# Patient Record
Sex: Female | Born: 1955
Health system: Southern US, Academic
[De-identification: ages and names within clinical notes are randomized; demographics above are authoritative.]

## PROBLEM LIST (undated history)

## (undated) ENCOUNTER — Encounter

## (undated) ENCOUNTER — Telehealth

## (undated) ENCOUNTER — Inpatient Hospital Stay

## (undated) ENCOUNTER — Other Ambulatory Visit

---

## 2016-04-11 DIAGNOSIS — K5900 Constipation, unspecified: Principal | ICD-10-CM

## 2016-04-13 MED ORDER — POLYETHYLENE GLYCOL 3350 PO POWD
1 refills
Start: 2016-04-13 — End: ?

## 2016-04-27 ENCOUNTER — Emergency Department: Admit: 2016-04-27 | Discharge: 2016-04-27

## 2016-04-27 ENCOUNTER — Encounter: Attending: Physician Assistant | Primary: Family Medicine

## 2016-04-27 ENCOUNTER — Inpatient Hospital Stay: Admit: 2016-04-27 | Discharge: 2016-04-27

## 2016-04-27 DIAGNOSIS — M1990 Unspecified osteoarthritis, unspecified site: Secondary | ICD-10-CM

## 2016-04-27 DIAGNOSIS — Z9109 Other allergy status, other than to drugs and biological substances: Secondary | ICD-10-CM

## 2016-04-27 DIAGNOSIS — K858 Other acute pancreatitis without necrosis or infection: Secondary | ICD-10-CM

## 2016-04-27 DIAGNOSIS — R011 Cardiac murmur, unspecified: Secondary | ICD-10-CM

## 2016-04-27 DIAGNOSIS — E1142 Type 2 diabetes mellitus with diabetic polyneuropathy: Secondary | ICD-10-CM

## 2016-04-27 DIAGNOSIS — R109 Unspecified abdominal pain: Principal | ICD-10-CM

## 2016-04-27 DIAGNOSIS — K219 Gastro-esophageal reflux disease without esophagitis: Secondary | ICD-10-CM

## 2016-04-27 DIAGNOSIS — Z8249 Family history of ischemic heart disease and other diseases of the circulatory system: Secondary | ICD-10-CM

## 2016-04-27 DIAGNOSIS — K859 Acute pancreatitis without necrosis or infection, unspecified: Secondary | ICD-10-CM

## 2016-04-27 DIAGNOSIS — I10 Essential (primary) hypertension: Secondary | ICD-10-CM

## 2016-04-27 DIAGNOSIS — R9341 Abnormal radiologic findings on diagnostic imaging of renal pelvis, ureter, or bladder: Secondary | ICD-10-CM

## 2016-04-27 DIAGNOSIS — Z79899 Other long term (current) drug therapy: Secondary | ICD-10-CM

## 2016-04-27 DIAGNOSIS — E119 Type 2 diabetes mellitus without complications: Principal | ICD-10-CM

## 2016-04-27 DIAGNOSIS — Z87891 Personal history of nicotine dependence: Secondary | ICD-10-CM

## 2016-04-27 DIAGNOSIS — J45909 Unspecified asthma, uncomplicated: Secondary | ICD-10-CM

## 2016-04-27 DIAGNOSIS — 1 ERRONEOUS ENCOUNTER--DISREGARD: Principal | ICD-9-CM

## 2016-04-27 DIAGNOSIS — Z803 Family history of malignant neoplasm of breast: Secondary | ICD-10-CM

## 2016-04-27 DIAGNOSIS — K5900 Constipation, unspecified: Secondary | ICD-10-CM

## 2016-04-27 DIAGNOSIS — N3001 Acute cystitis with hematuria: Secondary | ICD-10-CM

## 2016-04-27 DIAGNOSIS — Z794 Long term (current) use of insulin: Secondary | ICD-10-CM

## 2016-04-27 DIAGNOSIS — E785 Hyperlipidemia, unspecified: Secondary | ICD-10-CM

## 2016-04-27 MED ORDER — MORPHINE SULFATE 2 MG/ML IV SOLN CUSTOM JX
2 mg | Freq: Once | INTRAVENOUS | Status: CP
Start: 2016-04-27 — End: ?

## 2016-04-27 MED ORDER — ONDANSETRON HCL 4 MG/2ML IJ SOLN
4 mg | Freq: Once | INTRAVENOUS | Status: CP
Start: 2016-04-27 — End: ?

## 2016-04-27 MED ORDER — CEPHALEXIN 500 MG PO CAPS
500 mg | Freq: Three times a day (TID) | ORAL | 0 refills | Status: CP
Start: 2016-04-27 — End: 2016-05-18

## 2016-04-27 MED ORDER — IOHEXOL 300 MG/ML IJ SOLN
100 mL | Freq: Once | INTRAVENOUS | Status: DC
Start: 2016-04-27 — End: 2016-04-28

## 2016-04-27 MED ORDER — ONDANSETRON 4 MG PO TBDP
4 mg | Freq: Four times a day (QID) | ORAL | 0 refills | Status: CP | PRN
Start: 2016-04-27 — End: ?

## 2016-04-27 MED ORDER — KETOROLAC TROMETHAMINE 30 MG/ML IJ SOLN
30 mg | Freq: Once | INTRAVENOUS | Status: CP
Start: 2016-04-27 — End: ?

## 2016-04-27 MED ORDER — POLYETHYLENE GLYCOL 3350 PO POWD
3 refills | Status: CP
Start: 2016-04-27 — End: 2016-05-18

## 2016-04-27 MED ORDER — KETOROLAC TROMETHAMINE 10 MG PO TABS
10 mg | Freq: Four times a day (QID) | ORAL | 0 refills | Status: CP | PRN
Start: 2016-04-27 — End: 2016-07-24

## 2016-04-27 MED ORDER — IODIXANOL 320 MG/ML IV SOLN
100 mL | Freq: Once | INTRAVENOUS | Status: CP
Start: 2016-04-27 — End: ?

## 2016-04-27 NOTE — ED Notes
Patient complaining of abdominal pain, nausea, and constipation x 4 days. She also states she had a fever of 101F yesterday. Last normal BM was 1 week ago. She was being seen by Gastroenterology upstairs for the constipation, but they sent her here to the ED due to abdominal pain.  A&Ox3. Call bell within reach.

## 2016-04-27 NOTE — ED Provider Notes
Skin: Negative for color change, pallor and rash.   Neurological: Negative for dizziness, light-headedness and headaches.   Hematological: Negative for lymphadenopathy and easy bruising.     Psychiatric/Behavioral: Negative for confusion and depression.   Allergic/Immunologic: Negative for environmental allergies and food allergies.   Endocrine: Negative for polydipsia and polyuria.       Physical Exam       ED Triage Vitals   BP 04/27/16 1406 147/83   Pulse 04/27/16 1405 81   Resp 04/27/16 1407 18   Temp 04/27/16 1407 36.9 ?C (98.5 ?F)   Temp src 04/27/16 1407 Oral   Height 04/27/16 1407 1.651 m   Weight 04/27/16 1407 80 kg   SpO2 04/27/16 1405 100 %   BMI (Calculated) 04/27/16 1407 29.41       BP 142/90 - Pulse 81 - Temp 36.9 ?C (98.4 ?F) - Resp 16 - Ht 1.651 m - Wt 80 kg - SpO2 97% - BMI 29.35 kg/m2    Physical Exam   Constitutional: She is oriented to person, place, and time. She appears well-developed and well-nourished.   HENT:   Head: Normocephalic and atraumatic.   Eyes: Conjunctivae are normal.   Neck: Normal range of motion. Neck supple.   Cardiovascular: Normal rate and regular rhythm.    Pulmonary/Chest: Effort normal and breath sounds normal.   Abdominal: Soft. Normal appearance. There is generalized tenderness (worse in LLQ).   Musculoskeletal: Normal range of motion.   Neurological: She is alert and oriented to person, place, and time.   Skin: Skin is warm and dry.   Psychiatric: She has a normal mood and affect. Her behavior is normal.   Nursing note and vitals reviewed.      Differential DDx: diverticulitis, UTI, ischemic colitis, SBO, constipation, other    Is this an Emergent Medical Condition? Yes - Severe Pain/Acute Onset of Symptons  409.901 FS  641.19 FS  627.732 (16) FS    ED Workup   Procedures    Labs:  -   URINALYSIS W/MICROSCOPY - Abnormal        Result Value Ref Range    Protein-UA 30.0 (*) Negative mg/dL    Ketones UA Trace (*) Negative mg/dL    Nitrite -Ur Positive (*) Negative

## 2016-04-27 NOTE — ED Provider Notes
?   Heart murmur    ? Hyperlipidemia    ? Hypertension    ? Polyneuropathy in diabetes        Past Surgical History:   Procedure Laterality Date   ? CESAREAN SECTION     ? HERNIA REPAIR     ? HYSTERECTOMY         Family History   Problem Relation Age of Onset   ? High Blood Pressure Mother    ? Cancer Mother      breast and colon   ? Breast Cancer Mother 60   ? Depression Father    ? Heart Disease Father    ? High Blood Pressure Father    ? Cancer Father      brain   ? Cancer Sister      breast   ? Breast Cancer Sister 49   ? Cancer Maternal Aunt      throat       Social History     Social History   ? Marital status: Married     Spouse name: N/A   ? Number of children: N/A   ? Years of education: N/A     Social History Main Topics   ? Smoking status: Former Smoker   ? Smokeless tobacco: Former Neurosurgeon   ? Alcohol use No   ? Drug use: No   ? Sexual activity: Not on file     Other Topics Concern   ? Not on file     Social History Narrative       Review of Systems   Constitutional: Negative for fever, chills and fatigue.   HENT: Negative for ear pain and nasal discharge.    Eyes: Negative for pain and discharge.   Respiratory: Negative for cough, shortness of breath and wheezing.    Cardiovascular: Negative for chest pain.   Gastrointestinal: Positive for abdominal pain and constipation. Negative for nausea, vomiting, diarrhea and melena.   Genitourinary: Negative for dysuria and flank pain.   Musculoskeletal: Negative for back pain.   Skin: Negative for color change, pallor and rash.   Neurological: Negative for dizziness, light-headedness and headaches.   Hematological: Negative for lymphadenopathy and easy bruising.     Psychiatric/Behavioral: Negative for confusion and depression.   Allergic/Immunologic: Negative for environmental allergies and food allergies.   Endocrine: Negative for polydipsia and polyuria.       Physical Exam     ED Triage Vitals   BP 04/27/16 1407 147/83   Pulse 04/27/16 1407 83

## 2016-04-27 NOTE — ED Provider Notes
ED Course         ED Disposition   ED Disposition: No ED Disposition Set      ED Clinical Impression   ED Clinical Impression:   No Clinical Impression Set      ED Patient Status   Patient Status:   {SH ED JX PATIENT STATUS:[phone]}        ED Medical Evaluation Initiated   Medical Evaluation Initiated:   Yes, filed at 04/27/16 1409  by Bill Salinas, PA-C

## 2016-04-27 NOTE — ED Provider Notes
History     Chief Complaint   Patient presents with   ? Abdominal Pain   ? Constipation       HPI Comments: Patient is a 60 yo female with a PMH of DM (states well controlled), HTN, GERD, HLD who presents to the ED with a 4 day history of all over cramping/sharp/aching abdominal pain and a 7 day history of constipation.  No inciting events.  She states she usually takes glycolax powder for constipation which keeps her regular but it has not worked this past week.  Was at her GI doctor this AM who sent her to the ED due to pain.  Also complains of dysuria for the past couple days. She denies fever/chills/cp/sob/headache/focal neuro deficits today. Past abdominal surgeries include hernia repair, c-section, and hysterectomy. She also states h/o diverticulitis.    Patient is a 60 y.o. female presenting with abdominal pain. The history is provided by the patient. No language interpreter was used.   Abdominal Pain   Pain location:  Generalized  Pain quality: aching, cramping and sharp    Pain radiates to:  Does not radiate  Pain severity:  Severe  Onset quality:  Gradual  Duration:  4 days  Timing:  Constant  Progression:  Worsening  Chronicity:  New  Context: laxative use    Context: not alcohol use, not eating, not medication withdrawal, not recent illness, not recent travel, not retching, not sick contacts, not suspicious food intake and not trauma    Relieved by:  Nothing  Worsened by:  Nothing  Ineffective treatments:  None tried  Associated symptoms: constipation and dysuria    Associated symptoms: no chest pain, no chills, no cough, no diarrhea, no fatigue, no fever, no melena, no nausea, no shortness of breath and no vomiting    Risk factors: no alcohol abuse, no aspirin use, not elderly, no NSAID use, not obese and not pregnant        Allergies   Allergen Reactions   ? No Known Drug Allergy      No Known Drug Allergies       Patient's Medications   New Prescriptions

## 2016-04-27 NOTE — ED Provider Notes
History     Chief Complaint   Patient presents with   ? Abdominal Pain   ? Constipation       HPI Comments: Patient is a 60 yo female with a PMH of DM (states well controlled), HTN, GERD, HLD who presents to the ED with a 4 day history of all over cramping/sharp/aching abdominal pain and a 7 day history of constipation.  No inciting events.  She states she usually takes glycolax powder for constipation which keeps her regular but it has not worked this past week.  Was at her GI doctor this AM who sent her to the ED due to pain.  Also complains of dysuria for the past couple days. She denies fever/chills/cp/sob/headache/focal neuro deficits today. Past abdominal surgeries include hernia repair, c-section, and hysterectomy. She also states h/o diverticulitis.    Patient is a 59 y.o. female presenting with abdominal pain. The history is provided by the patient. No language interpreter was used.   Abdominal Pain   Pain location:  Generalized  Pain quality: aching, cramping and sharp    Pain radiates to:  Does not radiate  Pain severity:  Severe  Onset quality:  Gradual  Duration:  4 days  Timing:  Constant  Progression:  Worsening  Chronicity:  New  Context: laxative use    Context: not alcohol use, not eating, not medication withdrawal, not recent illness, not recent travel, not retching, not sick contacts, not suspicious food intake and not trauma    Relieved by:  Nothing  Worsened by:  Nothing  Ineffective treatments:  None tried  Associated symptoms: constipation and dysuria    Associated symptoms: no chest pain, no chills, no cough, no diarrhea, no fatigue, no fever, no melena, no nausea, no shortness of breath and no vomiting    Risk factors: no alcohol abuse, no aspirin use, not elderly, no NSAID use, not obese and not pregnant        Allergies   Allergen Reactions   ? No Known Drug Allergy      No Known Drug Allergies       Patient's Medications   New Prescriptions    No medications on file

## 2016-04-27 NOTE — ED Provider Notes
CEPHALEXIN (KEFLEX) 500 MG CAPSULE    Take 1 capsule by mouth 3 times daily.    KETOROLAC (TORADOL) 10 MG TABLET    Take 1 tablet by mouth every 6 hours as needed for pain.    ONDANSETRON (ZOFRAN-ODT) 4 MG TABLET DISINTEGRATING    Dissolve 1 tablet in mouth every 6 hours as needed.   Previous Medications    ATORVASTATIN (LIPITOR) 20 MG TABLET    Take 1 tablet by mouth nightly at bedtime.    ATORVASTATIN (LIPITOR) 20 MG TABLET    Take 1 tablet by mouth daily.    BUPROPION (WELLBUTRIN XL) 150 MG TABLET EXTENDED RELEASE 24 HOUR    Take 1 tablet by mouth every morning.    GUAIFENESIN-CODEINE 100-10 MG/5ML SYRUP    Take 5 mLs by mouth every 6 hours as needed.    HYDROCHLOROTHIAZIDE (HYDRODIURIL) 25 MG TABLET    Take 1 tablet by mouth daily.    HYDROCODONE-ACETAMINOPHEN (NORCO) 5-325 MG TABLET    Take 1 tablet by mouth every 8 hours as needed for pain. Earliest Fill Date: 11/01/15    IBUPROFEN (ADVIL,MOTRIN) 600 MG TABLET    Take 1 Tablet by mouth every 6 hours as needed for pain.    INSULIN ASPART (NOVOLOG) 100 UNIT/ML SOLUTION PEN-INJECTOR    Inject 15 Units into the skin 3 times daily (before meals).    INSULIN DETEMIR (LEVEMIR) 100 UNIT/ML SOLUTION PEN-INJECTOR    Inject 30 Units into the skin nightly at bedtime.    LISINOPRIL (PRINIVIL,ZESTRIL) 5 MG TABLET    Take 1 Tablet by mouth daily.    NOVOFINE 32G X 6 MM MISCELLANEOUS    USE 1 AT BEDTIME AND 3 DAILY BEFORE MEALS    OMEPRAZOLE (PRILOSEC) 40 MG CAPSULE DELAYED RELEASE    Take 1 capsule by mouth daily.    POLYETHYLENE GLYCOL (GLYCOLAX) POWDER    MIX 2 CAPFUL (34 GM) IN 8 - 16 OUNCES OF WATER, JUICE OR TEA AND DRINK DAILY.    PROAIR HFA 108 (90 BASE) MCG/ACT AEROSOL SOLUTION    Inhale 1 puff every 6 hours as needed for wheezing.    VARENICLINE (CHANTIX STARTING MONTH PAK) 0.5 MG X 11 & 1 MG X 42 TABLET    Take one 0.5mg  tab by mouth once daily x3 days; then increase   Modified Medications    Modified Medication Previous Medication

## 2016-04-27 NOTE — ED Provider Notes
History     Chief Complaint   Patient presents with   ? Abdominal Pain   ? Constipation       HPI Comments: Patient is a 60 yo female with a PMH of DM (states well controlled), HTN, GERD, HLD who presents to the ED with a 4 day history of all over cramping/sharp/aching abdominal pain and a 7 day history of constipation.  No inciting events.  She states she usually takes glycolax powder for constipation which keeps her regular but it has not worked this past week.  Was at her GI doctor this AM who sent her to the ED due to pain.  She denies fever/chills/cp/sob/headache/focal neuro deficits today. Past abdominal surgeries include hernia repair, c-section, and hysterectomy. She also states h/o diverticulitis.    Patient is a 60 y.o. female presenting with abdominal pain. The history is provided by the patient. No language interpreter was used.   Abdominal Pain   Pain location:  Generalized  Pain quality: aching, cramping and sharp    Pain radiates to:  Does not radiate  Pain severity:  Severe  Onset quality:  Gradual  Duration:  4 days  Timing:  Constant  Progression:  Worsening  Chronicity:  New  Context: laxative use    Context: not alcohol use, not eating, not medication withdrawal, not recent illness, not recent travel, not retching, not sick contacts, not suspicious food intake and not trauma    Relieved by:  Nothing  Worsened by:  Nothing  Ineffective treatments:  None tried  Associated symptoms: constipation    Associated symptoms: no chest pain, no chills, no cough, no diarrhea, no dysuria, no fatigue, no fever, no melena, no nausea, no shortness of breath and no vomiting    Risk factors: no alcohol abuse, no aspirin use, not elderly, no NSAID use, not obese and not pregnant        Allergies   Allergen Reactions   ? No Known Drug Allergy      No Known Drug Allergies       Patient's Medications   New Prescriptions    No medications on file   Previous Medications

## 2016-04-27 NOTE — ED Notes
Patient given verbal and written discharge instructions. Verbalized understanding. Discharged home with all personal belongings. Patient in stable condition.

## 2016-04-27 NOTE — ED Provider Notes
Previous Medications    ATORVASTATIN (LIPITOR) 20 MG TABLET    Take 1 tablet by mouth nightly at bedtime.    ATORVASTATIN (LIPITOR) 20 MG TABLET    Take 1 tablet by mouth daily.    BUPROPION (WELLBUTRIN XL) 150 MG TABLET EXTENDED RELEASE 24 HOUR    Take 1 tablet by mouth every morning.    GUAIFENESIN-CODEINE 100-10 MG/5ML SYRUP    Take 5 mLs by mouth every 6 hours as needed.    HYDROCHLOROTHIAZIDE (HYDRODIURIL) 25 MG TABLET    Take 1 tablet by mouth daily.    HYDROCODONE-ACETAMINOPHEN (NORCO) 5-325 MG TABLET    Take 1 tablet by mouth every 8 hours as needed for pain. Earliest Fill Date: 11/01/15    IBUPROFEN (ADVIL,MOTRIN) 600 MG TABLET    Take 1 Tablet by mouth every 6 hours as needed for pain.    INSULIN ASPART (NOVOLOG) 100 UNIT/ML SOLUTION PEN-INJECTOR    Inject 15 Units into the skin 3 times daily (before meals).    INSULIN DETEMIR (LEVEMIR) 100 UNIT/ML SOLUTION PEN-INJECTOR    Inject 30 Units into the skin nightly at bedtime.    LISINOPRIL (PRINIVIL,ZESTRIL) 5 MG TABLET    Take 1 Tablet by mouth daily.    NOVOFINE 32G X 6 MM MISCELLANEOUS    USE 1 AT BEDTIME AND 3 DAILY BEFORE MEALS    OMEPRAZOLE (PRILOSEC) 40 MG CAPSULE DELAYED RELEASE    Take 1 capsule by mouth daily.    POLYETHYLENE GLYCOL (GLYCOLAX) POWDER    Mix 2 capful (34 gm) in 8 - 16 ounces of water, juice or tea and drink daily.    POLYETHYLENE GLYCOL (GLYCOLAX) POWDER    MIX 2 CAPFUL (34 GM) IN 8 - 16 OUNCES OF WATER, JUICE OR TEA AND DRINK DAILY.    PROAIR HFA 108 (90 BASE) MCG/ACT AEROSOL SOLUTION    Inhale 1 puff every 6 hours as needed for wheezing.    VARENICLINE (CHANTIX STARTING MONTH PAK) 0.5 MG X 11 & 1 MG X 42 TABLET    Take one 0.5mg  tab by mouth once daily x3 days; then increase   Modified Medications    No medications on file   Discontinued Medications    No medications on file       Past Medical History:   Diagnosis Date   ? Allergy to environmental factors    ? Arthritis    ? Asthma    ? Diabetes mellitus

## 2016-04-27 NOTE — ED Provider Notes
{  Imaging [phone]}    EKG (Read by ED Provider):  {EKG [phone]}    MDM    ED Course & Re-Evaluation     ED Course         ED Disposition   ED Disposition: No ED Disposition Set      ED Clinical Impression   ED Clinical Impression:   No Clinical Impression Set      ED Patient Status   Patient Status:   {SH ED [facility] PATIENT STATUS:[phone]}        ED Medical Evaluation Initiated   Medical Evaluation Initiated:   Yes, filed at 04/27/16 1409  by Bill Salinas, PA-C

## 2016-04-27 NOTE — ED Provider Notes
ATORVASTATIN (LIPITOR) 20 MG TABLET    Take 1 tablet by mouth nightly at bedtime.    ATORVASTATIN (LIPITOR) 20 MG TABLET    Take 1 tablet by mouth daily.    BUPROPION (WELLBUTRIN XL) 150 MG TABLET EXTENDED RELEASE 24 HOUR    Take 1 tablet by mouth every morning.    GUAIFENESIN-CODEINE 100-10 MG/5ML SYRUP    Take 5 mLs by mouth every 6 hours as needed.    HYDROCHLOROTHIAZIDE (HYDRODIURIL) 25 MG TABLET    Take 1 tablet by mouth daily.    HYDROCODONE-ACETAMINOPHEN (NORCO) 5-325 MG TABLET    Take 1 tablet by mouth every 8 hours as needed for pain. Earliest Fill Date: 11/01/15    IBUPROFEN (ADVIL,MOTRIN) 600 MG TABLET    Take 1 Tablet by mouth every 6 hours as needed for pain.    INSULIN ASPART (NOVOLOG) 100 UNIT/ML SOLUTION PEN-INJECTOR    Inject 15 Units into the skin 3 times daily (before meals).    INSULIN DETEMIR (LEVEMIR) 100 UNIT/ML SOLUTION PEN-INJECTOR    Inject 30 Units into the skin nightly at bedtime.    LISINOPRIL (PRINIVIL,ZESTRIL) 5 MG TABLET    Take 1 Tablet by mouth daily.    NOVOFINE 32G X 6 MM MISCELLANEOUS    USE 1 AT BEDTIME AND 3 DAILY BEFORE MEALS    OMEPRAZOLE (PRILOSEC) 40 MG CAPSULE DELAYED RELEASE    Take 1 capsule by mouth daily.    POLYETHYLENE GLYCOL (GLYCOLAX) POWDER    Mix 2 capful (34 gm) in 8 - 16 ounces of water, juice or tea and drink daily.    POLYETHYLENE GLYCOL (GLYCOLAX) POWDER    MIX 2 CAPFUL (34 GM) IN 8 - 16 OUNCES OF WATER, JUICE OR TEA AND DRINK DAILY.    PROAIR HFA 108 (90 BASE) MCG/ACT AEROSOL SOLUTION    Inhale 1 puff every 6 hours as needed for wheezing.    VARENICLINE (CHANTIX STARTING MONTH PAK) 0.5 MG X 11 & 1 MG X 42 TABLET    Take one 0.5mg  tab by mouth once daily x3 days; then increase   Modified Medications    No medications on file   Discontinued Medications    No medications on file       Past Medical History:   Diagnosis Date   ? Allergy to environmental factors    ? Arthritis    ? Asthma    ? Diabetes mellitus    ? GERD (gastroesophageal reflux disease)

## 2016-04-27 NOTE — ED Provider Notes
BP 04/27/16 1407 147/83   Pulse 04/27/16 1407 83   Resp 04/27/16 1407 18   Temp 04/27/16 1407 36.9 ?C (98.5 ?F)   Temp src 04/27/16 1407 Oral   Height 04/27/16 1407 1.651 m   Weight 04/27/16 1407 80 kg   SpO2 04/27/16 1407 99 %   BMI (Calculated) 04/27/16 1407 29.41       BP 147/83 - Pulse 83 - Temp 36.9 ?C (98.5 ?F) (Oral)  - Resp 18 - Ht 1.651 m - Wt 80 kg - SpO2 99% - BMI 29.35 kg/m2    Physical Exam   Constitutional: She is oriented to person, place, and time. She appears well-developed and well-nourished.   HENT:   Head: Normocephalic and atraumatic.   Eyes: Conjunctivae are normal.   Neck: Normal range of motion. Neck supple.   Cardiovascular: Normal rate and regular rhythm.    Pulmonary/Chest: Effort normal and breath sounds normal.   Abdominal: Soft. Normal appearance. There is generalized tenderness (worse in LLQ).   Musculoskeletal: Normal range of motion.   Neurological: She is alert and oriented to person, place, and time.   Skin: Skin is warm and dry.   Psychiatric: She has a normal mood and affect. Her behavior is normal.   Nursing note and vitals reviewed.      Differential DDx: ***    Is this an Emergent Medical Condition? {SH ED EMERGENT MEDICAL CONDITION:[id number]}  409.901 FS  641.19 FS  627.732 (16) FS    ED Workup   Procedures    Labs:  - - No data to display      Imaging (Read by ED Provider):  {Imaging [phone]}    EKG (Read by ED Provider):  {EKG [phone]}    MDM  Number of Diagnoses or Management Options     Amount and/or Complexity of Data Reviewed  Clinical lab tests: ordered and reviewed  Tests in the radiology section of CPT?: ordered and reviewed  Discussion of test results with the performing providers: no  Decide to obtain previous medical records or to obtain history from someone other than the patient: no  Obtain history from someone other than the patient: no  Review and summarize past medical records: no

## 2016-04-27 NOTE — ED Provider Notes
Resp 04/27/16 1407 18   Temp 04/27/16 1407 36.9 ?C (98.5 ?F)   Temp src 04/27/16 1407 Oral   Height 04/27/16 1407 1.651 m   Weight 04/27/16 1407 80 kg   SpO2 04/27/16 1407 99 %   BMI (Calculated) 04/27/16 1407 29.41       BP 147/83 - Pulse 83 - Temp 36.9 ?C (98.5 ?F) (Oral)  - Resp 18 - Ht 1.651 m - Wt 80 kg - SpO2 99% - BMI 29.35 kg/m2    Physical Exam   Constitutional: She is oriented to person, place, and time. She appears well-developed and well-nourished.   HENT:   Head: Normocephalic and atraumatic.   Eyes: Conjunctivae are normal.   Neck: Normal range of motion. Neck supple.   Cardiovascular: Normal rate and regular rhythm.    Pulmonary/Chest: Effort normal and breath sounds normal.   Abdominal: Soft. Normal appearance. There is generalized tenderness (worse in LLQ).   Musculoskeletal: Normal range of motion.   Neurological: She is alert and oriented to person, place, and time.   Skin: Skin is warm and dry.   Psychiatric: She has a normal mood and affect. Her behavior is normal.   Nursing note and vitals reviewed.      Differential DDx: ***    Is this an Emergent Medical Condition? {SH ED EMERGENT MEDICAL [phone]}  409.901 FS  641.19 FS  627.732 (16) FS    ED Workup   Procedures    Labs:  - - No data to display      Imaging (Read by ED Provider):  {Imaging findings:[phone]}    EKG (Read by ED Provider):  {EKG findings:[phone]}    MDM  Number of Diagnoses or Management Options     Amount and/or Complexity of Data Reviewed  Clinical lab tests: ordered and reviewed  Tests in the radiology section of CPT?: ordered and reviewed  Discussion of test results with the performing providers: no  Decide to obtain previous medical records or to obtain history from someone other than the patient: no  Obtain history from someone other than the patient: no  Review and summarize past medical records: no  Independent visualization of images, tracings, or specimens: yes        ED Course & Re-Evaluation

## 2016-04-27 NOTE — ED Provider Notes
?   GERD (gastroesophageal reflux disease)    ? Heart murmur    ? Hyperlipidemia    ? Hypertension    ? Polyneuropathy in diabetes        Past Surgical History:   Procedure Laterality Date   ? CESAREAN SECTION     ? HERNIA REPAIR     ? HYSTERECTOMY         Family History   Problem Relation Age of Onset   ? High Blood Pressure Mother    ? Cancer Mother      breast and colon   ? Breast Cancer Mother 70   ? Depression Father    ? Heart Disease Father    ? High Blood Pressure Father    ? Cancer Father      brain   ? Cancer Sister      breast   ? Breast Cancer Sister 43   ? Cancer Maternal Aunt      throat       Social History     Social History   ? Marital status: Married     Spouse name: N/A   ? Number of children: N/A   ? Years of education: N/A     Social History Main Topics   ? Smoking status: Former Smoker   ? Smokeless tobacco: Former Neurosurgeon   ? Alcohol use No   ? Drug use: No   ? Sexual activity: Not on file     Other Topics Concern   ? Not on file     Social History Narrative       Review of Systems   Constitutional: Negative for fever, chills and fatigue.   HENT: Negative for ear pain and nasal discharge.    Eyes: Negative for pain and discharge.   Respiratory: Negative for cough, shortness of breath and wheezing.    Cardiovascular: Negative for chest pain.   Gastrointestinal: Positive for abdominal pain and constipation. Negative for nausea, vomiting, diarrhea and melena.   Genitourinary: Positive for dysuria. Negative for flank pain.   Musculoskeletal: Negative for back pain.   Skin: Negative for color change, pallor and rash.   Neurological: Negative for dizziness, light-headedness and headaches.   Hematological: Negative for lymphadenopathy and easy bruising.     Psychiatric/Behavioral: Negative for confusion and depression.   Allergic/Immunologic: Negative for environmental allergies and food allergies.   Endocrine: Negative for polydipsia and polyuria.       Physical Exam     ED Triage Vitals

## 2016-04-27 NOTE — ED Provider Notes
Urea Nitrogen 11  6 - 22 mg/dL    Creatinine 0.71  0.51 - 0.95 mg/dL    BUN/Creatinine Ratio 15.5  6.0 - 22.0 (calc)    Glucose 95  71 - 99 mg/dL    Calcium 9.4  8.6 - 10.0 mg/dL    Osmolality Calc 287.9      Anion Gap 16  4 - 16 mmol/L    EGFR >59  mL/min/1.73M2    Comment:   Reference range: =>90 ml/min/1.73M2  eGFR estimates are unable to accurately differentiate levels of GFR above 60 ml/min/1.73M2.   CBC AND DIFFERENTIAL         Imaging (Read by ED Provider):  Per Radiology: Ct Ed Only Abd/pel W/ Iv Con (no Oral)    Result Date: 04/27/2016  Procedure:  CT ED ONLY ABD/PELVIS- W/IV CON ONLY (NO ORAL CONTRAST) Ordering History:  Abdominal pain Comparison: None Technique: Axial images of the abdomen and pelvis from the lung bases to the proximal femurs  after the administration of intravenous contrast are performed.   High resolution multiplanar reconstructions are also obtained. Findings: Abdomen: Liver: Normal liver morphology without a discrete mass Gallbladder: No radiopaque gallstones or biliary ductal dilatation Spleen: Not enlarged and no discrete mass is identified Pancreas: There is mild interstitial edema within the head and neck of the pancreas. There is extensive peripancreatic fat stranding near the head, neck, and uncinate process. No ductal dilatation. No peripancreatic fluid collection. The pancreas enhances uniformly. The splenic and portal veins are patent. Kidneys: Both kidneys enhance homogeneously without a discrete mass or hydronephrosis. 2 cm cyst in the medial lower pole of the left kidney. A few other small cysts are too small to characterize by CT imaging. 5 mm calcification in upper pole calyx on the left. Adrenals: Both adrenal glands are normal Bowel: There is moderate mural thickening of the second third and fourth portions of the duodenum. The jejunal and ileal loops are normal. No obstruction. The appendix is identified and is normal.

## 2016-04-27 NOTE — ED Provider Notes
Leukocytes -Ur Small (*) Negative    WBC -Ur 31 (*) 0 - 5 /HPF    Bacteria -Ur 2+ (*) None seen /HPF    Color -Ur Yellow  Amber    Clarity, UA Hazy  Hazy    Specific Gravity, Urine 1.016  1.003 - 1.030    pH, Urine 7.0  4.5 - 8.0    Glucose -Ur Negative  Negative mg/dL    Bilirubin -Ur Negative  Negative    Blood -Ur Negative  Negative    Urobilinogen -Ur Normal  Normal    RBC -Ur 1  0 - 5 /HPF    Squam Epithel, UA <1  Not established /HPF    Mucus -Ur Rare  2+ /LPF    ASCORBIC ACID Negative  20 mg/dL   LIPASE - Abnormal     Lipase 64 (*) 0 - 60 U/L   HEPATIC FUNCTION PANEL - Abnormal     AST 11 (*) 14 - 33 IU/L    Albumin 4.4  3.8 - 4.9 g/dL    Total Bilirubin 0.4  0.2 - 1.0 mg/dL    Bilirubin, Direct 0.2  0.0 - 0.2 mg/dL    Bilirubin, Indirect 0.2  mg/dL    Alkaline Phosphatase 75  35 - 104 IU/L    ALT 10  10 - 42 IU/L    Total Protein 7.2  6.5 - 8.3 g/dL    ALBUMIN/GLOBULIN RATIO 1.6  (calc)    Calc Total Globuin 2.8  gm/dL   CBC AUTODIFF - Abnormal     MPV 11.6 (*) 9.5 - 11.5 fl    Monocytes % 6.6 (*) 2.0 - 6.0 %    WBC 10.00  4.5 - 11 x10E3/uL    RBC 4.56  4.20 - 5.40 x10E6/uL    Hemoglobin 13.1  12.0 - 16.0 g/dL    Hematocrit 15.1  76.1 - 47.0 %    MCV 83.6  82.0 - 101.0 fl    MCH 28.7  27.0 - 34.0 pg    MCHC 34.4  31.0 - 36.0 g/dL    RDW 60.7  37.1 - 06.2 %    Platelet Count 252  140 - 440 thou/cu mm    nRBC % 0.0  0.0 - 1.0 %    Absolute NRBC Count 0.00      Neutrophils % 65.6  34.0 - 73.0 %    Lymphocytes % 26.0  25.0 - 45.0 %    Eosinophils % 1.2  1.0 - 4.0 %    Immature Granulocytes % 0.3  0.0 - 2.0 %    Neutrophils Absolute 6.56  1.80 - 8.70 x10E3/uL    Lymphocytes Absolute 2.60  x10E3/uL    Monocytes Absolute 0.66  x10E3/uL    Eosinophils Absolute 0.12  x10E3/uL    Basophil Absolute 0.03  x10E3/uL    Basophils % 0.3  0 - 1 %   CULTURE, URINE   BASIC METABOLIC PANEL    Sodium 145  694 - 145 mmol/L    Potassium 3.5  3.3 - 4.6 mmol/L    Chloride 104  101 - 110 mmol/L    CO2 25  21 - 29 mmol/L

## 2016-04-27 NOTE — ED Provider Notes
History     Chief Complaint   Patient presents with   ? Abdominal Pain   ? Constipation       HPI    Allergies   Allergen Reactions   ? No Known Drug Allergy      No Known Drug Allergies       Patient's Medications   New Prescriptions    No medications on file   Previous Medications    ATORVASTATIN (LIPITOR) 20 MG TABLET    Take 1 tablet by mouth nightly at bedtime.    ATORVASTATIN (LIPITOR) 20 MG TABLET    Take 1 tablet by mouth daily.    BUPROPION (WELLBUTRIN XL) 150 MG TABLET EXTENDED RELEASE 24 HOUR    Take 1 tablet by mouth every morning.    GUAIFENESIN-CODEINE 100-10 MG/5ML SYRUP    Take 5 mLs by mouth every 6 hours as needed.    HYDROCHLOROTHIAZIDE (HYDRODIURIL) 25 MG TABLET    Take 1 tablet by mouth daily.    HYDROCODONE-ACETAMINOPHEN (NORCO) 5-325 MG TABLET    Take 1 tablet by mouth every 8 hours as needed for pain. Earliest Fill Date: 11/01/15    IBUPROFEN (ADVIL,MOTRIN) 600 MG TABLET    Take 1 Tablet by mouth every 6 hours as needed for pain.    INSULIN ASPART (NOVOLOG) 100 UNIT/ML SOLUTION PEN-INJECTOR    Inject 15 Units into the skin 3 times daily (before meals).    INSULIN DETEMIR (LEVEMIR) 100 UNIT/ML SOLUTION PEN-INJECTOR    Inject 30 Units into the skin nightly at bedtime.    LISINOPRIL (PRINIVIL,ZESTRIL) 5 MG TABLET    Take 1 Tablet by mouth daily.    NOVOFINE 32G X 6 MM MISCELLANEOUS    USE 1 AT BEDTIME AND 3 DAILY BEFORE MEALS    OMEPRAZOLE (PRILOSEC) 40 MG CAPSULE DELAYED RELEASE    Take 1 capsule by mouth daily.    POLYETHYLENE GLYCOL (GLYCOLAX) POWDER    Mix 2 capful (34 gm) in 8 - 16 ounces of water, juice or tea and drink daily.    POLYETHYLENE GLYCOL (GLYCOLAX) POWDER    MIX 2 CAPFUL (34 GM) IN 8 - 16 OUNCES OF WATER, JUICE OR TEA AND DRINK DAILY.    PROAIR HFA 108 (90 BASE) MCG/ACT AEROSOL SOLUTION    Inhale 1 puff every 6 hours as needed for wheezing.    VARENICLINE (CHANTIX STARTING MONTH PAK) 0.5 MG X 11 & 1 MG X 42 TABLET    Take one 0.5mg  tab by mouth once daily x3 days; then increase

## 2016-04-27 NOTE — ED Provider Notes
POLYETHYLENE GLYCOL (GLYCOLAX) POWDER polyethylene glycol (GLYCOLAX) Powder       Mix 2 capful (34 gm) in 8 - 16 ounces of water, juice or tea and drink daily.    Mix 2 capful (34 gm) in 8 - 16 ounces of water, juice or tea and drink daily.   Discontinued Medications    No medications on file       Past Medical History:   Diagnosis Date   ? Allergy to environmental factors    ? Arthritis    ? Asthma    ? Diabetes mellitus    ? GERD (gastroesophageal reflux disease)    ? Heart murmur    ? Hyperlipidemia    ? Hypertension    ? Polyneuropathy in diabetes        Past Surgical History:   Procedure Laterality Date   ? CESAREAN SECTION     ? HERNIA REPAIR     ? HYSTERECTOMY         Family History   Problem Relation Age of Onset   ? High Blood Pressure Mother    ? Cancer Mother      breast and colon   ? Breast Cancer Mother 38   ? Depression Father    ? Heart Disease Father    ? High Blood Pressure Father    ? Cancer Father      brain   ? Cancer Sister      breast   ? Breast Cancer Sister 18   ? Cancer Maternal Aunt      throat       Social History     Social History   ? Marital status: Married     Spouse name: N/A   ? Number of children: N/A   ? Years of education: N/A     Social History Main Topics   ? Smoking status: Former Smoker   ? Smokeless tobacco: Former Neurosurgeon   ? Alcohol use No   ? Drug use: No   ? Sexual activity: Not Asked     Other Topics Concern   ? None     Social History Narrative       Review of Systems   Constitutional: Negative for fever, chills and fatigue.   HENT: Negative for ear pain and nasal discharge.    Eyes: Negative for pain and discharge.   Respiratory: Negative for cough, shortness of breath and wheezing.    Cardiovascular: Negative for chest pain.   Gastrointestinal: Positive for abdominal pain and constipation. Negative for nausea, vomiting, diarrhea and melena.   Genitourinary: Positive for dysuria. Negative for flank pain.   Musculoskeletal: Negative for back pain.

## 2016-04-27 NOTE — ED Provider Notes
Modified Medications    No medications on file   Discontinued Medications    No medications on file       Past Medical History:   Diagnosis Date   ? Allergy to environmental factors    ? Arthritis    ? Asthma    ? Diabetes mellitus    ? GERD (gastroesophageal reflux disease)    ? Heart murmur    ? Hyperlipidemia    ? Hypertension    ? Polyneuropathy in diabetes        Past Surgical History:   Procedure Laterality Date   ? CESAREAN SECTION     ? HERNIA REPAIR     ? HYSTERECTOMY         Family History   Problem Relation Age of Onset   ? High Blood Pressure Mother    ? Cancer Mother      breast and colon   ? Breast Cancer Mother 23   ? Depression Father    ? Heart Disease Father    ? High Blood Pressure Father    ? Cancer Father      brain   ? Cancer Sister      breast   ? Breast Cancer Sister 63   ? Cancer Maternal Aunt      throat       Social History     Social History   ? Marital status: Married     Spouse name: N/A   ? Number of children: N/A   ? Years of education: N/A     Social History Main Topics   ? Smoking status: Former Smoker   ? Smokeless tobacco: Former Neurosurgeon   ? Alcohol use No   ? Drug use: No   ? Sexual activity: Not on file     Other Topics Concern   ? Not on file     Social History Narrative       Review of Systems    Physical Exam     ED Triage Vitals   BP 04/27/16 1407 147/83   Pulse 04/27/16 1407 83   Resp 04/27/16 1407 18   Temp 04/27/16 1407 36.9 ?C (98.5 ?F)   Temp src 04/27/16 1407 Oral   Height 04/27/16 1407 1.651 m   Weight 04/27/16 1407 80 kg   SpO2 04/27/16 1407 99 %   BMI (Calculated) 04/27/16 1407 29.41       BP 147/83 - Pulse 83 - Temp 36.9 ?C (98.5 ?F) (Oral)  - Resp 18 - Ht 1.651 m - Wt 80 kg - SpO2 99% - BMI 29.35 kg/m2    Physical Exam    Differential DDx: ***    Is this an Emergent Medical Condition? {SH ED EMERGENT MEDICAL [phone]}  409.901 FS  641.19 FS  627.732 (16) FS    ED Workup   Procedures    Labs:  - - No data to display      Imaging (Read by ED Provider):

## 2016-04-27 NOTE — ED Provider Notes
has not been vomiting.  Asking for snacks and ginger ale.  Will DC with toradol and zofran.  BRAT diet information given.  Will DC with abx for UTI, toradol and zofran.  Return precautions given.  AMenable to DC.    ED Disposition   ED Disposition: Discharge      ED Clinical Impression   ED Clinical Impression:   Other pancreatitis  Constipation, unspecified constipation type  Acute cystitis with hematuria      ED Patient Status   Patient Status:   Good        ED Medical Evaluation Initiated   Medical Evaluation Initiated:   Yes, filed at 04/27/16 1409  by Bill Salinas, PA-C

## 2016-04-27 NOTE — ED Provider Notes
Independent visualization of images, tracings, or specimens: yes        ED Course & Re-Evaluation     ED Course         ED Disposition   ED Disposition: No ED Disposition Set      ED Clinical Impression   ED Clinical Impression:   No Clinical Impression Set      ED Patient Status   Patient Status:   {SH ED JX PATIENT STATUS:[id number]}        ED Medical Evaluation Initiated   Medical Evaluation Initiated:   Yes, filed at 04/27/16 1409  by Bill Salinas, PA-C

## 2016-04-27 NOTE — ED Provider Notes
Scattered colonic diverticulosis. Mesentery/retroperitoneum: Diffuse true peritoneal fat stranding within the upper abdomen extending into the lesser sac. Small. Umbilical hernia containing trace fluid. Pelvis: Mild concentric bladder wall thickening. Calcified uterine fibroids. Vasculature: The celiac axis, SMA and IMA are patent. The portal vein, splenic vein and SMV are  patent without thrombus Osseous structures: No suspicious osseous lesions Lung bases: Dependent changes in the lung bases. Probable remote LAD territory subendocardial infarct.     Impression: 1. Findings are most consistent with acute interstitial pancreatitis. No peripancreatic fluid collection. Normal pancreatic enhancement. Correlation with lipase is recommended. 2. Moderate mural thickening of the duodenum is likely reactive to inflammation in the upper abdomen. 3. Colonic diverticulosis without diverticulitis. 4. Mild concentric bladder wall thickening, likely due to decompression. Read By Rolly Salter Letter  Electronically Verified By - Rolly Salter Letter  Released Date Time - 04/27/2016 6:34 PM  Resident -       EKG (Read by ED Provider):  not applicable    MDM  Number of Diagnoses or Management Options     Amount and/or Complexity of Data Reviewed  Clinical lab tests: ordered and reviewed  Tests in the radiology section of CPT?: ordered and reviewed  Discussion of test results with the performing providers: no  Decide to obtain previous medical records or to obtain history from someone other than the patient: no  Obtain history from someone other than the patient: no  Review and summarize past medical records: no  Independent visualization of images, tracings, or specimens: yes        ED Course & Re-Evaluation     ED Course     Mild pancreatitis seen on CT.  Lipase mildly elevated at 66.  Urinalysis shows nitrie positive UTI.  No white count, no bands. Electrolytes wnl.  Patient doing much better after toradol and zofran.  She has no fever and

## 2016-04-28 NOTE — Progress Notes
This encounter was created in error - please disregard.  This encounter was created in error - please disregard.

## 2016-04-29 ENCOUNTER — Ambulatory Visit: Attending: Physician Assistant | Primary: Family Medicine

## 2016-04-29 DIAGNOSIS — Z9109 Other allergy status, other than to drugs and biological substances: Secondary | ICD-10-CM

## 2016-04-29 DIAGNOSIS — R748 Abnormal levels of other serum enzymes: Secondary | ICD-10-CM

## 2016-04-29 DIAGNOSIS — E1142 Type 2 diabetes mellitus with diabetic polyneuropathy: Secondary | ICD-10-CM

## 2016-04-29 DIAGNOSIS — M1990 Unspecified osteoarthritis, unspecified site: Secondary | ICD-10-CM

## 2016-04-29 DIAGNOSIS — K219 Gastro-esophageal reflux disease without esophagitis: Secondary | ICD-10-CM

## 2016-04-29 DIAGNOSIS — R011 Cardiac murmur, unspecified: Secondary | ICD-10-CM

## 2016-04-29 DIAGNOSIS — J45909 Unspecified asthma, uncomplicated: Secondary | ICD-10-CM

## 2016-04-29 DIAGNOSIS — N390 Urinary tract infection, site not specified: Secondary | ICD-10-CM

## 2016-04-29 DIAGNOSIS — R112 Nausea with vomiting, unspecified: Secondary | ICD-10-CM

## 2016-04-29 DIAGNOSIS — E119 Type 2 diabetes mellitus without complications: Secondary | ICD-10-CM

## 2016-04-29 DIAGNOSIS — E785 Hyperlipidemia, unspecified: Secondary | ICD-10-CM

## 2016-04-29 DIAGNOSIS — I10 Essential (primary) hypertension: Secondary | ICD-10-CM

## 2016-04-29 DIAGNOSIS — R109 Unspecified abdominal pain: Secondary | ICD-10-CM

## 2016-04-29 DIAGNOSIS — K859 Acute pancreatitis without necrosis or infection, unspecified: Principal | ICD-10-CM

## 2016-04-29 DIAGNOSIS — R933 Abnormal findings on diagnostic imaging of other parts of digestive tract: Secondary | ICD-10-CM

## 2016-04-29 NOTE — Progress Notes
PROAIR HFA 108 (90 BASE) MCG/ACT Aerosol Solution Inhale 1 puff every 6 hours as needed for wheezing. 05/22/15  Yes Robb Matar, MD   varenicline (CHANTIX STARTING MONTH PAK) 0.5 MG X 11 & 1 MG X 42 tablet Take one 0.5mg  tab by mouth once daily x3 days; then increase 12/19/15  Yes Robb Matar, MD   buPROPion (WELLBUTRIN XL) 150 MG Tablet Extended Release 24 Hour Take 1 tablet by mouth every morning. 11/01/15   Robb Matar, MD   cephALEXin (KEFLEX) 500 MG Capsule Take 1 capsule by mouth 3 times daily. 04/27/16   Bill Salinas, PA-C   guaiFENesin-codeine 100-10 MG/5ML Syrup Take 5 mLs by mouth every 6 hours as needed. 05/22/15   Robb Matar, MD   ibuprofen (ADVIL,MOTRIN) 600 MG Tablet Take 1 Tablet by mouth every 6 hours as needed for pain. 11/22/14   Robb Matar, MD         Review of Systems  Constitution - patient denies fevers chills or weight loss.  Musculoskeletal - patient denies muscle pain and weakness and difficulty with gait  Hematological - patient denies easy bleeding and bruising.  Neurological - patient denies headache or loss of consciousness.  Allergic/immune - patient denies runny nose, dry eyes, skin itching and recurrent infections.   Eye - patient denies eye pain or redness.  Skin - patient denies itching and yellow color to skin.  Psychiatric - patient denies active depression and anxiety.  Cardiopulmonary -  Denies chest pain, shortness of breath, cough, and palpitations  Urinary -  Denies hematuria, dysuria, and decreased urination.  G I -  Denies + nausea, vomiting, heartburn, dysphagia, odynophagia, + abdominal pain, diarrhea, + constipation, melena, hematochezia, and anorexia.    All other systems reviewed and negative.      Physical Exam:     VITAL SIGNS (all recorded)      Clinic Vitals       04/27/16 1337             Amb Encounter Vitals    Height 1.626 m (5\' 4" )    -AM at 04/27/16 1339       BP 143/87    -AM at 04/27/16 1339

## 2016-04-29 NOTE — Progress Notes
Chief Complaint: Constipation      Referring Provider: Robb Matar, MD  [id number]  [place], [place] [address]      History of Present Illness: Stacey Boyer is a 60 year old female with history of diabetes, hypertension, hyperlipidemia, GERD who is seen in follow-up for severe abdominal pain.    Patient actually presented to the office on 04/27/16 with severe abdominal pain. When she was room she was very hysterical and unable to cooperate due to pain. She was immediately transported down to the emergency department.    At that time patient reported 4 day history of crampy sharp abdominal pain after a seven-day bout of constipation. She has history of chronic constipation and was taking MiraLAX but lost effectiveness further week. She was also having genitourinary symptoms. In the emergency department urinalysis was consistent with UTI and she was started on Keflex. Lipase was elevated at 64. CT scan of the abdomen and pelvis was obtained revealing mild edema of the pancreas and the head and neck with fat stranding adjacent to the pancreas near the head, neck, and uncinate process. There was no pancreatic ductal dilation or fluid. There was also moderate mural thickening of the second third and fourth portions of the duodenum. Findings were consistent with acute interstitial pancreatitis. Gallbladder, liver, spleen were unremarkable.    Since then patient has had improvement in her bowel pattern with MiraLAX 2 capfuls daily. Her last bowel movement was this morning. She feels much better but is still extremely weak. She has had decreased nausea but no vomiting.    Patient denies prior history of pancreatitis, alcohol use, hypertriglyceridemia, or family history of pancreatic cancer.    Severity: Severe  Quality: Crampy, sharp  Location: Diffuse abdomen  Duration: Approximately 1 week  Context: Worse with constipation  Timing: Constant for several days  Associated symptoms: Nausea

## 2016-04-29 NOTE — Progress Notes
1. Acute pancreatitis, unspecified complication status, unspecified pancreatitis type K85.90 577.0    2. Elevated lipase R74.8 790.5    3. Acute abdominal pain R10.9 789.00      338.19    4. Nausea and vomiting, intractability of vomiting not specified, unspecified vomiting type R11.2 787.01    5. Urinary tract infection without hematuria, site unspecified N39.0 599.0    6. Abnormal CT scan, small bowel R93.3 793.4          PLAN:   Clears x 2 days then slowly advance w/ soft, bland diet to regular avoiding fatty, greasy foods    Continue miralax as needed    EGD/EUS at [address]    RTC: after procedure      No orders of the following type(s) were placed in this encounter: Medications.       COUNSELING:  Esophagogastroduoden:  The risks, benefits and alternatives were explained in detail to the patient.  Risks including but not limited to bleeding, perforation, adverse reaction to medications, cardiopulmonary compromise and their attendant sequelae explained.  Sequelae including but not limited to need for surgery, prolonged hospital stay, placement of drainage tubes, blood transfusions, disability, deformity, morbidity, and mortality were explained.   she indicates understanding and wishes to proceed.    Wandalee Ferdinand, PA-C  Gastroenterology   04/29/2016 10:31 AM

## 2016-04-29 NOTE — Progress Notes
She reports that she does not drink alcohol or use illicit drugs.    Current Outpatient Medications    Medication Sig Start Date End Date Taking? Authorizing Provider   atorvastatin (LIPITOR) 20 MG Tablet Take 1 tablet by mouth nightly at bedtime. 11/01/15  Yes Robb Matar, MD   atorvastatin (LIPITOR) 20 MG Tablet Take 1 tablet by mouth daily. 03/04/16  Yes Robb Matar, MD   hydroCHLOROthiazide (HYDRODIURIL) 25 MG Tablet Take 1 tablet by mouth daily. 12/04/15  Yes Day, Charisse March, PA-C   HYDROcodone-acetaminophen (NORCO) 5-325 MG Tablet Take 1 tablet by mouth every 8 hours as needed for pain. Earliest Fill Date: 11/01/15 11/01/15  Yes Robb Matar, MD   insulin aspart (NovoLOG) 100 UNIT/ML Solution Pen-injector Inject 15 Units into the skin 3 times daily (before meals). 11/01/15  Yes Robb Matar, MD   insulin detemir (LEVEMIR) 100 UNIT/ML Solution Pen-injector Inject 30 Units into the skin nightly at bedtime. 11/01/15  Yes Robb Matar, MD   ketorolac (TORADOL) 10 MG Tablet Take 1 tablet by mouth every 6 hours as needed for pain. 04/27/16  Yes Lambert, Caitlyn, PA-C   lisinopril (PRINIVIL,ZESTRIL) 5 MG Tablet Take 1 Tablet by mouth daily. 11/22/14  Yes Robb Matar, MD   NOVOFINE 32G X 6 MM Miscellaneous USE 1 AT BEDTIME AND 3 DAILY BEFORE MEALS 10/29/15  Yes Robb Matar, MD   omeprazole (PriLOSEC) 40 MG Capsule Delayed Release Take 1 capsule by mouth daily. 01/08/15  Yes Caryl Asp, PA-C   ondansetron (ZOFRAN-ODT) 4 MG Tablet Disintegrating Dissolve 1 tablet in mouth every 6 hours as needed. 04/27/16  Yes Lambert, Caitlyn, PA-C   polyethylene glycol (GLYCOLAX) Powder MIX 2 CAPFUL (34 GM) IN 8 - 16 OUNCES OF WATER, JUICE OR TEA AND DRINK DAILY. 01/23/16  Yes Nasrallah, Arvil Chaco, PA-C   polyethylene glycol (GLYCOLAX) Powder Mix 2 capful (34 gm) in 8 - 16 ounces of water, juice or tea and drink daily. 04/27/16  Yes Bill Salinas, PA-C

## 2016-04-29 NOTE — Progress Notes
Modifying factors: ED visit showed elevated lipase and CT findings consistent with acute interstitial pancreatitis.    The patient's medical records and laboratory data was reviewed. ED data, labs, CT 04/27/16.     has a past medical history of Allergy to environmental factors; Arthritis; Asthma; Diabetes mellitus; GERD (gastroesophageal reflux disease); Heart murmur; Hyperlipidemia; Hypertension; and Polyneuropathy in diabetes. She also has no past medical history of Abdominal hernia; Anemia; Anxiety; Blood transfusion without reported diagnosis; Cancer; Cataract; Celiac disease; Cerebral artery occlusion with cerebral infarction; CHF (congestive heart failure); Cholelithiasis; Chronic constipation; Chronic diarrhea; Chronic kidney disease; Chronic skin ulcer; Cirrhosis; Clotting disorder; Colon cancer; Colon polyp; COPD (chronic obstructive pulmonary disease); Crohn's disease; Cystic fibrosis; Depression; Emphysema of lung; Encopresis; Esophageal cancer; Food intolerance; Glaucoma; Heart attack; Hemochromatosis; Hepatitis; History of general anesthesia complication; HIV infection; Inflammatory bowel disease; Irritable bowel syndrome; Liver cancer; Liver disease; Meningitis; Osteoporosis; Pancreatic cancer; Pancreatitis; Rectal cancer; Seizures; Sickle cell anemia; Stomach cancer; Substance abuse; Thyroid disease; Tuberculosis; Ulcerative colitis; or Wilson's disease.     has a past surgical history that includes Cesarean section; Hysterectomy; and hernia repair.    is allergic to no known drug allergy.    family history includes Breast Cancer (age of onset: 81) in her mother; Breast Cancer (age of onset: 66) in her sister; Cancer in her father, maternal aunt, mother, and sister; Depression in her father; Heart Disease in her father; High Blood Pressure in her father and mother.     reports that she has quit smoking. She has quit using smokeless tobacco.

## 2016-04-29 NOTE — Progress Notes
No teaching statement needed

## 2016-04-29 NOTE — Progress Notes
BP Location Left upper arm    -AM at 04/27/16 1339       Position Sitting    -AM at 04/27/16 1339       Pulse 117    -AM at 04/27/16 1339       Pulse Source Monitor    -AM at 04/27/16 1339       Resp 20    -AM at 04/27/16 1339       Respiration Quality Rapid    -AM at 04/27/16 1339       Temp 37.3 ?C (99.2 ?F)    -AM at 04/27/16 1339       Temperature Source Oral    -AM at 04/27/16 1339       Pain Score TEN    -AM at 04/27/16 1339       Location abdominal  for 4 days, worsen last night    -AM at 04/27/16 1339       Education/Communication Barriers?    Learning/Communication Barriers? No    -AM at 04/27/16 1339       Fall Risk Assessment    Had recent fall / Last 6 months? No recent fall    -AM at 04/27/16 1339       Does patient have a fear of falling? No    -AM at 04/27/16 1339         User Key  (r) = Recorded By, (t) = Taken By, (c) = Cosigned By    Initials Name Effective Dates    AM Wynelle Beckmann, MA 03/13/16 -         There is no height or weight on file to calculate BMI.    CONSTITUTION (General Appearance) - Healthy appearing at stated age.   HEAD AND NECK -  Pupils equal and reactive.  No icterus, pallor, congestion of nasal passages, lymphadenopathy.  SKIN - No rash or jaundice.  EYES - Pupils react normally and are non icteric.  ENT- Normal mucosal membranes and throat. normal dentition.  RESPIRATORY - Lungs clear to auscultation, normal respiratory effort.  CARDIOVASCULAR -  Normal heart sounds, normal abdominal aorta. Peripheral pulses present.   ABDOMEN -  Soft, moderate upper abd pain, no R/G/mass.  No organomegaly.  Normal bowel sounds. Non-distended.  EXTREMITIES -   No edema. Normal color. No varices  MUSCULOSKELETAL -Normal gait.  NEUROLOGICAL - No obvious deficits. Normal reflexes and sensation.  PSYCHIATRIC - Normal mood and oriented to person place and time, normal thought pattern, no flight of ideas or pressured speech.        ASSESSMENT    ICD-10-CM ICD-9-CM

## 2016-04-30 DIAGNOSIS — J45909 Unspecified asthma, uncomplicated: Secondary | ICD-10-CM

## 2016-04-30 DIAGNOSIS — I10 Essential (primary) hypertension: Secondary | ICD-10-CM

## 2016-04-30 DIAGNOSIS — E785 Hyperlipidemia, unspecified: Secondary | ICD-10-CM

## 2016-04-30 DIAGNOSIS — E119 Type 2 diabetes mellitus without complications: Secondary | ICD-10-CM

## 2016-04-30 DIAGNOSIS — E1142 Type 2 diabetes mellitus with diabetic polyneuropathy: Secondary | ICD-10-CM

## 2016-04-30 DIAGNOSIS — R011 Cardiac murmur, unspecified: Secondary | ICD-10-CM

## 2016-04-30 DIAGNOSIS — K219 Gastro-esophageal reflux disease without esophagitis: Secondary | ICD-10-CM

## 2016-04-30 DIAGNOSIS — K859 Acute pancreatitis without necrosis or infection, unspecified: Secondary | ICD-10-CM

## 2016-04-30 DIAGNOSIS — M1990 Unspecified osteoarthritis, unspecified site: Secondary | ICD-10-CM

## 2016-04-30 DIAGNOSIS — Z9109 Other allergy status, other than to drugs and biological substances: Secondary | ICD-10-CM

## 2016-05-05 ENCOUNTER — Encounter: Attending: Family Medicine | Primary: Family Medicine

## 2016-05-18 ENCOUNTER — Ambulatory Visit: Attending: Family Medicine | Primary: Family Medicine

## 2016-05-18 DIAGNOSIS — K859 Acute pancreatitis without necrosis or infection, unspecified: Principal | ICD-10-CM

## 2016-05-18 DIAGNOSIS — Z6830 Body mass index (BMI) 30.0-30.9, adult: Secondary | ICD-10-CM

## 2016-05-20 ENCOUNTER — Encounter: Attending: Physician Assistant | Primary: Family Medicine

## 2016-05-20 NOTE — Progress Notes
HPI       Stacey Boyer is a 60 y.o. female with a history of IDDM, lumbar spine OA, GERD, HPLD, HTN, and tobacco use disorder here for Stomach Pain (F/U from [facility])    Stacey Boyer is here today for a follow up after being [facility]. The pt was referred to the ED by gastroenterology (Dr. Izora RibasNasrallah) due to the severity of her symptoms. She states that she was experiencing a severe sharp, cramping abdominal pain for four days prior to her admission. The pt was also experiencing abdominal distension and severe constipation for ~7 days prior to her admission despite her compliance with her Glycolax powder.  The pt was initiated on Toradol 10 mg and Zofran 4 mg for symptomatic management of diagnoses: pancreatitis and interstitial cystitis. However she states that she was not prescribed an antibiotic upon being discharged from the ED. The pt states that she was advised to follow up with gastroenterology for endoscopy. However she states that the soonest appointment that she was able to be seen was Novemeber. So subsequently the [facility]Dr[facility] Lorella NimrodHarvey) for endoscopy. The pt brought her results with her today to the clinic. She reports fasting blood glucose levels that range between 70's and 130. The pt is compliant with a low carb diet and frequent exercise. She states that she feels ~10% since being evaluated in the ER. The pt continues to have moderate to severe LLQ abdominal pain, abdominal distention, decreased appetite and constipation. The pt states that she was unable to tolerate food/liquids even after being discharged from the hospital and reports that overeating exacerbates her abdominal pain. She states that she has had decreased appetite as well and has felt weakened as a result.      Pain level and fall risk were assessed. Health issues since the last visit were reviewed.    Past Medical History:   Diagnosis Date

## 2016-05-20 NOTE — Progress Notes
?   Acute pancreatitis, unspecified complication status, unspecified pancreatitis type 04/30/2016   ? Allergy to environmental factors    ? Arthritis    ? Asthma    ? Diabetes mellitus    ? GERD (gastroesophageal reflux disease)    ? Heart murmur    ? Hyperlipidemia    ? Hypertension    ? Polyneuropathy in diabetes      Past Surgical History:   Procedure Laterality Date   ? CESAREAN SECTION     ? HERNIA REPAIR     ? HYSTERECTOMY       Family History   Problem Relation Age of Onset   ? High Blood Pressure Mother    ? Cancer Mother      breast and colon   ? Breast Cancer Mother [id number]   ? Depression Father    ? Heart Disease Father    ? High Blood Pressure Father    ? Cancer Father      brain   ? Cancer Sister      breast   ? Breast Cancer Sister [id number]   ? Cancer Maternal Aunt      throat     Social History   Substance Use Topics   ? Smoking status: Former Smoker   ? Smokeless tobacco: Former NeurosurgeonUser   ? Alcohol use No     Current Outpatient Prescriptions on File Prior to Visit   Medication Sig   ? atorvastatin (LIPITOR) 20 MG Tablet Take 1 tablet by mouth daily.   ? buPROPion (WELLBUTRIN XL) 150 MG Tablet Extended Release 24 Hour Take 1 tablet by mouth every morning.   ? hydroCHLOROthiazide (HYDRODIURIL) 25 MG Tablet Take 1 tablet by mouth daily.   ? HYDROcodone-acetaminophen (NORCO) 5-325 MG Tablet Take 1 tablet by mouth every 8 hours as needed for pain. Earliest Fill Date: 11/01/15   ? ibuprofen (ADVIL,MOTRIN) 600 MG Tablet Take 1 Tablet by mouth every 6 hours as needed for pain.   ? insulin aspart (NovoLOG) 100 UNIT/ML Solution Pen-injector Inject 15 Units into the skin 3 times daily (before meals).   ? insulin detemir (LEVEMIR) 100 UNIT/ML Solution Pen-injector Inject 30 Units into the skin nightly at bedtime.   ? ketorolac (TORADOL) 10 MG Tablet Take 1 tablet by mouth every 6 hours as needed for pain.   ? lisinopril (PRINIVIL,ZESTRIL) 5 MG Tablet Take 1 Tablet by mouth daily.

## 2016-05-20 NOTE — Progress Notes
CBC (with or without Differential):   Lab Results   Component Value Date    WBC 10.00 04/27/2016    HGB 13.1 04/27/2016    HGB 13.6 05/28/2015    HCT 38.1 04/27/2016    HCT 41.3 05/28/2015    MCV 83.6 04/27/2016    MCV 86.7 05/28/2015    MCH 28.7 04/27/2016    MCH 28.5 05/28/2015    MCHC 34.4 04/27/2016    MCHC 32.9 05/28/2015    RDW 14.6 04/27/2016    RDW 15.8 (H) 05/28/2015    PLATCOUNT 252 04/27/2016    MPV 11.6 (H) 04/27/2016    MPV 10.3 05/28/2015    NEUTROPCT 65.6 04/27/2016    NEUTROPCT 44.4 05/28/2015    LYMPHPCT 44.6 05/28/2015    MONOPCT 6.6 (H) 04/27/2016    MONOPCT 7.3 05/28/2015    EOSPCT 1.2 04/27/2016    EOSPCT 2.9 05/28/2015    BASOPCT 0.3 04/27/2016    BASOPCT 0.8 05/28/2015   , BMP/CMP:   Lab Results   Component Value Date    NA 145 04/27/2016    NA 142 05/28/2015    K 3.5 04/27/2016    K 4.3 05/28/2015    CL 104 04/27/2016    CL 104 05/28/2015    CO2 25 04/27/2016    CO2 29 05/28/2015    BUN 11 04/27/2016    BUN 16 05/28/2015    CREATININE 0.71 04/27/2016    CREATININE 0.76 05/28/2015    GLU 95 04/27/2016    GLU 87 05/28/2015    GLU Negative 10/15/2011    CALCIUM 9.4 04/27/2016    CALCIUM 9.8 05/28/2015    TPROT 7.2 04/27/2016    ALB 4.4 04/27/2016    ALB 4.3 05/28/2015    AST 11 (L) 04/27/2016    AST 12 05/28/2015    ALT 10 04/27/2016    ALT 12 05/28/2015    EGFR >59 04/27/2016    EGFR 87 05/28/2015    TBILI 0.4 04/27/2016    TBILI 0.4 05/28/2015    ALKPHOS 75 04/27/2016    ALKPHOS 74 05/28/2015   , Lipid Profile    Lab Results   Component Value Date/Time    CHOL 163 05/28/2015 10:41 AM    TRIG 82 05/28/2015 10:41 AM    HDL 45 (L) 05/28/2015 10:41 AM    LDL 102 05/28/2015 10:41 AM    LDL 218 (H) 05/15/2013 11:28 AM   , Thyroid Studies    Lab Results   Component Value Date    TSH 0.45 05/28/2015    and HGBA1C:    Lab Results   Component Value Date/Time    HGBA1C 6.9 11/01/2015    HGBA1C 6.1 (H) 05/28/2015 10:41 AM     Assessment/ Plan

## 2016-05-20 NOTE — Progress Notes
Acute pancreatitis, unspecified complication status, unspecified pancreatitis type  Advised pt to followed NPO/clear diet. Will stop toradol and hold all NSAID's. Initiated Norco 5-325 mg and Tylenol for management of severe pain. Initiated pancreatic enzymes. Advised pt that if she experiences any pain after eating that she needs to skip the next meal. If pain worsens pt needs was advised to go to the ED. Advised pt to hold insulin until FBG > 130.     Body mass index 30.0-30.9, adult  Discussed benefits of maintaining a healthy weight with the patient. Behavior modification, exercise (150 minutes of moderate intensity exercise per week), and dietary changes were encouraged. Will consider pharmacologic therapy for patients with a BMI greater than 30 kg/m2 that have failed to lose weight with diet and exercise alone.    Reviewed previous labs/diagnostics/notes with patient.     F/u as scheduled or sooner as needed. Patient advised to be vigilant and to report any worsening symptoms. If concerning symptoms emerge or worsen, please seek treatment at ED.     Discussed plan of care with patient. Patient is also advised to various treatment options and agreed to the following: taking medication as directed, keeping follow up visits. Risks verses benefits of medication and treatment were discussed. Patient voiced agreement and understanding.    Goals     None        Scribe Attestation: I, [employer]asmine Angers, have acted as a Neurosurgeonscribe for Robb MatarJeremy Latron Coleman, MD 12:54 PM 05/18/2016     Physician Attestation: I have reviewed and confirmed the information stated by the scribe and made corrections and edits as appropriate. Despite these efforts, minor errors may be noted. I have personally provided the services documented by the scribe.   ?  Riki RuskJeremy L. Effie Shyoleman, MD  ?

## 2016-05-20 NOTE — Progress Notes
Acute pancreatitis, unspecified complication status, unspecified pancreatitis type  Advised pt to followed NPO/clear diet. Will stop toradol and hold all NSAID's. Initiated Norco 5-325 mg and Tylenol for management of severe pain. Initiated pancreatic enzymes. Advised pt that if she experiences any pain after eating that she needs to skip the next meal. If pain worsens pt needs was advised to go to the ED. Advised pt to hold insulin until FBG > 130.     Body mass index 30.0-30.9, adult  Discussed benefits of maintaining a healthy weight with the patient. Behavior modification, exercise (150 minutes of moderate intensity exercise per week), and dietary changes were encouraged. Will consider pharmacologic therapy for patients with a BMI greater than 30 kg/m2 that have failed to lose weight with diet and exercise alone.    Reviewed previous labs/diagnostics/notes with patient.     F/u as scheduled or sooner as needed. Patient advised to be vigilant and to report any worsening symptoms. If concerning symptoms emerge or worsen, please seek treatment at ED.     Discussed plan of care with patient. Patient is also advised to various treatment options and agreed to the following: taking medication as directed, keeping follow up visits. Risks verses benefits of medication and treatment were discussed. Patient voiced agreement and understanding.    Goals     None        Scribe Attestation: I, [employer] Ang[employer], have acted as a Neurosurgeonscribe for Robb MatarJeremy Latron Coleman, MD 12:54 PM 05/18/2016     Physician Attestation: Marland Kitchen***

## 2016-05-20 NOTE — Progress Notes
BMI (Calculated) 29.59    -CJ at 05/18/16 1439       BSA (Calculated - sq m) 1.88    -CJ at 05/18/16 1439       BP 144/89    -CJ at 05/18/16 1439       BP Location Left upper arm    -CJ at 05/18/16 1439       Position Sitting    -CJ at 05/18/16 1439       Pulse 85    -CJ at 05/18/16 1439       Pulse Source Other (Comment)    -CJ at 05/18/16 1439       Pulse Quality Normal    -CJ at 05/18/16 1439       Resp 19    -CJ at 05/18/16 1439       Respiration Quality Normal    -CJ at 05/18/16 1439       Temp 36.9 ?C (98.5 ?F)    -CJ at 05/18/16 1439       Temperature Source Oral    -CJ at 05/18/16 1439       O2 Saturation 98 %    -CJ at 05/18/16 1439       FiO2 Source RA    -CJ at 05/18/16 1439       Pain Score Zero    -CJ at 05/18/16 1439       Education/Communication Barriers?    Learning/Communication Barriers? No    -CJ at 05/18/16 1439       Fall Risk Assessment    Had recent fall / Last 6 months? No recent fall    -CJ at 05/18/16 1439       Does patient have a fear of falling? No    -CJ at 05/18/16 1439         User Key  (r) = Recorded By, (t) = Taken By, (c) = Cosigned By    Initials Name Effective Dates    Colin InaJ Jackson, [place], [place] 12/31/15 -         Physical Exam  Constitution: Well developed, obese, appears stated age  HEENT: Lids without erythema or lesion, conjunctivae pink, sclera white. Hears soft spoken voice without difficulty  NECK: Supple without stridor   RESPIRATORY: Non labored breathing, symmetric chest wall movement  CV: RRR; no m/g/r, no JVD; no pedal edema   ABD: soft, nondistended, nontender, normoactive bowel sounds  MUSCULOSKELETAL: Normal gait, good muscle tone and strength; no swelling, tenderness, limitation of motion of any joint.   SKIN: warm and well perfused.   NEUROLOGIC: CN: II-XII grossly intact.  PSYCH: oriented to time, person, place; normal speech and content; appropriate mood and affect; interactive and responsive; memory, judgment, and insight are intact

## 2016-05-20 NOTE — Progress Notes
?   NOVOFINE 32G X 6 MM Miscellaneous USE 1 AT BEDTIME AND 3 DAILY BEFORE MEALS   ? omeprazole (PriLOSEC) 40 MG Capsule Delayed Release Take 1 capsule by mouth daily.   ? ondansetron (ZOFRAN-ODT) 4 MG Tablet Disintegrating Dissolve 1 tablet in mouth every 6 hours as needed.   ? polyethylene glycol (GLYCOLAX) Powder MIX 2 CAPFUL (34 GM) IN 8 - 16 OUNCES OF WATER, JUICE OR TEA AND DRINK DAILY.   ? varenicline (CHANTIX STARTING MONTH PAK) 0.5 MG X 11 & 1 MG X 42 tablet Take one 0.5mg  tab by mouth once daily x3 days; then increase   ? [DISCONTINUED] atorvastatin (LIPITOR) 20 MG Tablet Take 1 tablet by mouth nightly at bedtime.   ? [DISCONTINUED] cephALEXin (KEFLEX) 500 MG Capsule Take 1 capsule by mouth 3 times daily.   ? [DISCONTINUED] guaiFENesin-codeine 100-10 MG/5ML Syrup Take 5 mLs by mouth every 6 hours as needed.   ? [DISCONTINUED] polyethylene glycol (GLYCOLAX) Powder Mix 2 capful (34 gm) in 8 - 16 ounces of water, juice or tea and drink daily.   ? [DISCONTINUED] PROAIR HFA 108 (90 BASE) MCG/ACT Aerosol Solution Inhale 1 puff every 6 hours as needed for wheezing.     No current facility-administered medications on file prior to visit.      Allergies   Allergen Reactions   ? No Known Drug Allergy      No Known Drug Allergies     Review of Systems   Review of Systems   Constitutional: Negative for chills and fever.   Respiratory: Negative for shortness of breath.    Cardiovascular: Negative for chest pain, palpitations and leg swelling.   Gastrointestinal: Positive for abdominal distention, abdominal pain (LLQ) and constipation. Negative for blood in stool, diarrhea, nausea and vomiting.   Skin: Negative for rash.   Neurological: Positive for weakness. Negative for syncope and numbness.     Physical Exam        VITAL SIGNS (all recorded)      Clinic Vitals       05/18/16 1439             Amb Encounter Vitals    Weight 78 kg (172 lb)    -CJ at 05/18/16 1439       Height 1.626 m (5\' 4" )    -CJ at 05/18/16 1439

## 2016-07-08 DIAGNOSIS — I10 Essential (primary) hypertension: Secondary | ICD-10-CM

## 2016-07-09 MED ORDER — LISINOPRIL 5 MG PO TABS
5 mg | Freq: Every day | ORAL | 1 refills | Status: CP
Start: 2016-07-09 — End: ?

## 2016-07-21 DIAGNOSIS — N951 Menopausal and female climacteric states: Principal | ICD-10-CM

## 2016-07-21 MED ORDER — ESTRADIOL-NORETHINDRONE ACET 0.05-0.14 MG/DAY TD PTTW
1 | MEDICATED_PATCH | TRANSDERMAL | 5 refills | Status: CP
Start: 2016-07-21 — End: 2016-07-29

## 2016-07-24 ENCOUNTER — Ambulatory Visit: Attending: Family Medicine | Primary: Family Medicine

## 2016-07-24 DIAGNOSIS — N3001 Acute cystitis with hematuria: Secondary | ICD-10-CM

## 2016-07-24 DIAGNOSIS — Z9109 Other allergy status, other than to drugs and biological substances: Secondary | ICD-10-CM

## 2016-07-24 DIAGNOSIS — J45909 Unspecified asthma, uncomplicated: Secondary | ICD-10-CM

## 2016-07-24 DIAGNOSIS — R300 Dysuria: Principal | ICD-10-CM

## 2016-07-24 DIAGNOSIS — I10 Essential (primary) hypertension: Secondary | ICD-10-CM

## 2016-07-24 DIAGNOSIS — E119 Type 2 diabetes mellitus without complications: Secondary | ICD-10-CM

## 2016-07-24 DIAGNOSIS — E1142 Type 2 diabetes mellitus with diabetic polyneuropathy: Secondary | ICD-10-CM

## 2016-07-24 DIAGNOSIS — R011 Cardiac murmur, unspecified: Secondary | ICD-10-CM

## 2016-07-24 DIAGNOSIS — E785 Hyperlipidemia, unspecified: Secondary | ICD-10-CM

## 2016-07-24 DIAGNOSIS — M1990 Unspecified osteoarthritis, unspecified site: Secondary | ICD-10-CM

## 2016-07-24 DIAGNOSIS — K859 Acute pancreatitis without necrosis or infection, unspecified: Secondary | ICD-10-CM

## 2016-07-24 DIAGNOSIS — K219 Gastro-esophageal reflux disease without esophagitis: Secondary | ICD-10-CM

## 2016-07-24 MED ORDER — NITROFURANTOIN MONOHYD MACRO 100 MG PO CAPS
100 mg | Freq: Two times a day (BID) | ORAL | 0 refills | Status: CP
Start: 2016-07-24 — End: 2016-12-07

## 2016-07-24 NOTE — Patient Instructions
Labs are drawn in the office between 7:30 and 12 am. No appointment is necessary. Will need to be fasting for at least 8 hours prior.     Exercising to Lose Weight  Exercising can help you to lose weight. In order to lose weight through exercise, you need to do vigorous-intensity exercise. You can tell that you are exercising with vigorous intensity if you are breathing very hard and fast and cannot hold a conversation while exercising.  Moderate-intensity exercise helps to maintain your current weight. You can tell that you are exercising at a moderate level if you have a higher heart rate and faster breathing, but you are still able to hold a conversation.  HOW OFTEN SHOULD I EXERCISE?  Choose an activity that you enjoy and set realistic goals. Your health care provider can help you to make an activity plan that works for you. Exercise regularly as directed by your health care provider. This may include:  ? Doing resistance training twice each week, such as:    Push-ups.    Sit-ups.    Lifting weights.    Using resistance bands.  ? Doing a given intensity of exercise for a given amount of time. Choose from these options:    150 minutes of moderate-intensity exercise every week.    75 minutes of vigorous-intensity exercise every week.    A mix of moderate-intensity and vigorous-intensity exercise every week.  Children, pregnant women, people who are out of shape, people who are overweight, and older adults may need to consult a health care provider for individual recommendations. If you have any sort of medical condition, be sure to consult your health care provider before starting a new exercise program.  WHAT ARE SOME ACTIVITIES THAT CAN HELP ME TO LOSE WEIGHT?   ? Walking at a rate of at least 4.5 miles an hour.  ? Jogging or running at a rate of 5 miles per hour.  ? Biking at a rate of at least 10 miles per hour.  ? Lap swimming.  ? Roller-skating or in-line skating.  ? Cross-country skiing.

## 2016-07-24 NOTE — Patient Instructions
Exercising to Lose Weight  Exercising can help you to lose weight. In order to lose weight through exercise, you need to do vigorous-intensity exercise. You can tell that you are exercising with vigorous intensity if you are breathing very hard and fast and cannot hold a conversation while exercising.  Moderate-intensity exercise helps to maintain your current weight. You can tell that you are exercising at a moderate level if you have a higher heart rate and faster breathing, but you are still able to hold a conversation.  HOW OFTEN SHOULD I EXERCISE?  Choose an activity that you enjoy and set realistic goals. Your health care provider can help you to make an activity plan that works for you. Exercise regularly as directed by your health care provider. This may include:  ? Doing resistance training twice each week, such as:    Push-ups.    Sit-ups.    Lifting weights.    Using resistance bands.  ? Doing a given intensity of exercise for a given amount of time. Choose from these options:    150 minutes of moderate-intensity exercise every week.    75 minutes of vigorous-intensity exercise every week.    A mix of moderate-intensity and vigorous-intensity exercise every week.  Children, pregnant women, people who are out of shape, people who are overweight, and older adults may need to consult a health care provider for individual recommendations. If you have any sort of medical condition, be sure to consult your health care provider before starting a new exercise program.  WHAT ARE SOME ACTIVITIES THAT CAN HELP ME TO LOSE WEIGHT?   ? Walking at a rate of at least 4.5 miles an hour.  ? Jogging or running at a rate of 5 miles per hour.  ? Biking at a rate of at least 10 miles per hour.  ? Lap swimming.  ? Roller-skating or in-line skating.  ? Cross-country skiing.  ? Vigorous competitive sports, such as football, basketball, and soccer.  ? Jumping rope.  ? Aerobic dancing.

## 2016-07-24 NOTE — Patient Instructions
Reccommended medication Azo (pyridium) to provide relief of pain.     Labs are drawn in the office between 7:30 and 12 am. No appointment is necessary. Will need to be fasting for at least 8 hours prior.     Exercising to Lose Weight  Exercising can help you to lose weight. In order to lose weight through exercise, you need to do vigorous-intensity exercise. You can tell that you are exercising with vigorous intensity if you are breathing very hard and fast and cannot hold a conversation while exercising.  Moderate-intensity exercise helps to maintain your current weight. You can tell that you are exercising at a moderate level if you have a higher heart rate and faster breathing, but you are still able to hold a conversation.  HOW OFTEN SHOULD I EXERCISE?  Choose an activity that you enjoy and set realistic goals. Your health care provider can help you to make an activity plan that works for you. Exercise regularly as directed by your health care provider. This may include:  ? Doing resistance training twice each week, such as:    Push-ups.    Sit-ups.    Lifting weights.    Using resistance bands.  ? Doing a given intensity of exercise for a given amount of time. Choose from these options:    150 minutes of moderate-intensity exercise every week.    75 minutes of vigorous-intensity exercise every week.    A mix of moderate-intensity and vigorous-intensity exercise every week.  Children, pregnant women, people who are out of shape, people who are overweight, and older adults may need to consult a health care provider for individual recommendations. If you have any sort of medical condition, be sure to consult your health care provider before starting a new exercise program.  WHAT ARE SOME ACTIVITIES THAT CAN HELP ME TO LOSE WEIGHT?   ? Walking at a rate of at least 4.5 miles an hour.  ? Jogging or running at a rate of 5 miles per hour.  ? Biking at a rate of at least 10 miles per hour.  ? Lap swimming.

## 2016-07-24 NOTE — Patient Instructions
?   Vigorous competitive sports, such as football, basketball, and soccer.  ? Jumping rope.  ? Aerobic dancing.  HOW CAN I BE MORE ACTIVE IN MY DAY-TO-DAY ACTIVITIES?  ? Use the stairs instead of the elevator.  ? Take a walk during your lunch break.  ? If you drive, park your car farther away from work or school.  ? If you take public transportation, get off one stop early and walk the rest of the way.  ? Make all of your phone calls while standing up and walking around.  ? Get up, stretch, and walk around every 30 minutes throughout the day.  WHAT GUIDELINES SHOULD I FOLLOW WHILE EXERCISING?  ? Do not exercise so much that you hurt yourself, feel dizzy, or get very short of breath.  ? Consult your health care provider prior to starting a new exercise program.  ? Wear comfortable clothes and shoes with good support.  ? Drink plenty of water while you exercise to prevent dehydration or heat stroke. Body water is lost during exercise and must be replaced.  ? Work out until you breathe faster and your heart beats faster.     This information is not intended to replace advice given to you by your health care provider. Make sure you discuss any questions you have with your health care provider.     Document Released: 10/17/2010 Document Revised: 10/05/2014 Document Reviewed: 02/15/2014  [employer] Interactive Patient Education ?2017 [employer]

## 2016-07-24 NOTE — Patient Instructions
?   Roller-skating or in-line skating.  ? Cross-country skiing.  ? Vigorous competitive sports, such as football, basketball, and soccer.  ? Jumping rope.  ? Aerobic dancing.  HOW CAN I BE MORE ACTIVE IN MY DAY-TO-DAY ACTIVITIES?  ? Use the stairs instead of the elevator.  ? Take a walk during your lunch break.  ? If you drive, park your car farther away from work or school.  ? If you take public transportation, get off one stop early and walk the rest of the way.  ? Make all of your phone calls while standing up and walking around.  ? Get up, stretch, and walk around every 30 minutes throughout the day.  WHAT GUIDELINES SHOULD I FOLLOW WHILE EXERCISING?  ? Do not exercise so much that you hurt yourself, feel dizzy, or get very short of breath.  ? Consult your health care provider prior to starting a new exercise program.  ? Wear comfortable clothes and shoes with good support.  ? Drink plenty of water while you exercise to prevent dehydration or heat stroke. Body water is lost during exercise and must be replaced.  ? Work out until you breathe faster and your heart beats faster.     This information is not intended to replace advice given to you by your health care provider. Make sure you discuss any questions you have with your health care provider.     Document Released: 10/17/2010 Document Revised: 10/05/2014 Document Reviewed: 02/15/2014  Elsevier Interactive Patient Education ?2017 Elsevier Inc.

## 2016-07-24 NOTE — Patient Instructions
HOW CAN I BE MORE ACTIVE IN MY DAY-TO-DAY ACTIVITIES?  ? Use the stairs instead of the elevator.  ? Take a walk during your lunch break.  ? If you drive, park your car farther away from work or school.  ? If you take public transportation, get off one stop early and walk the rest of the way.  ? Make all of your phone calls while standing up and walking around.  ? Get up, stretch, and walk around every 30 minutes throughout the day.  WHAT GUIDELINES SHOULD I FOLLOW WHILE EXERCISING?  ? Do not exercise so much that you hurt yourself, feel dizzy, or get very short of breath.  ? Consult your health care provider prior to starting a new exercise program.  ? Wear comfortable clothes and shoes with good support.  ? Drink plenty of water while you exercise to prevent dehydration or heat stroke. Body water is lost during exercise and must be replaced.  ? Work out until you breathe faster and your heart beats faster.     This information is not intended to replace advice given to you by your health care provider. Make sure you discuss any questions you have with your health care provider.     Document Released: 10/17/2010 Document Revised: 10/05/2014 Document Reviewed: 02/15/2014  Elsevier Interactive Patient Education ?2017 Elsevier Inc.

## 2016-07-27 NOTE — Progress Notes
NA 142 05/28/2015    K 3.5 04/27/2016    K 4.3 05/28/2015    CL 104 04/27/2016    CL 104 05/28/2015    CO2 25 04/27/2016    CO2 29 05/28/2015    BUN 11 04/27/2016    BUN 16 05/28/2015    CREATININE 0.71 04/27/2016    CREATININE 0.76 05/28/2015    GLU 95 04/27/2016    GLU 87 05/28/2015    GLU Negative 10/15/2011    CALCIUM 9.4 04/27/2016    CALCIUM 9.8 05/28/2015    TPROT 7.2 04/27/2016    ALB 4.4 04/27/2016    ALB 4.3 05/28/2015    AST 11 (L) 04/27/2016    AST 12 05/28/2015    ALT 10 04/27/2016    ALT 12 05/28/2015    EGFR >59 04/27/2016    EGFR 87 05/28/2015    TBILI 0.4 04/27/2016    TBILI 0.4 05/28/2015    ALKPHOS 75 04/27/2016    ALKPHOS 74 05/28/2015   , Lipid Profile    Lab Results   Component Value Date/Time    CHOL 163 05/28/2015 10:41 AM    TRIG 82 05/28/2015 10:41 AM    HDL 45 (L) 05/28/2015 10:41 AM    LDL 102 05/28/2015 10:41 AM    LDL 218 (H) 05/15/2013 11:28 AM   , Thyroid Studies    Lab Results   Component Value Date    TSH 0.45 05/28/2015    and HGBA1C:    Lab Results   Component Value Date/Time    HGBA1C 6.9 11/01/2015    HGBA1C 6.1 (H) 05/28/2015 10:41 AM     Assessment/ Plan     Dysuria  Acute cystitis with hematuria  -     POCT urine dip w/o micro- Non Auto  -     nitrofurantoin (macrocrystalline) (MACROBID) 100 MG Capsule; Take 1 capsule by mouth 2 times daily for 7 days.  Dispense: 14 capsule; Refill: 0  POCT UA showed leukocytes and trace blood. Will initiate antimicrobial therapy (Nitrofurantoin) for management of suspected infection. Advised the pt to use OTC pyridium for symptomatic relief.     Pt declined to give weight today  Information about the benefits of maintaining a healthy weight was provided to the patient. Behavior modification, exercise (150 minutes of moderate intensity exercise per week), and dietary changes were encouraged. Will consider pharmacologic therapy for patients with a BMI greater than 30 kg/m2 that have failed to lose weight with diet and exercise alone.

## 2016-07-27 NOTE — Progress Notes
Reviewed pertinent labs/diagnostics/notes with patient.     Discussed plan of care with patient. Patient is also advised to various treatment options and agreed to the following: taking medication as directed, keeping follow up visits.     Risks verses benefits of medication and treatment were discussed. Patient voiced agreement and understanding.    F/u as scheduled or sooner as needed. Patient advised to be vigilant and to report any worsening symptoms. If concerning symptoms emerge or worsen, please seek treatment at ED.     Goals     ? Exercise 150 minutes per week (moderate activity)     ? HEMOGLOBIN A1C < 7.0     ? Reduce salt intake to 2 grams per day or less           Health Maintenance   Topic Date Due   ? Pneumovax / Prevnar (1 of 1 - PPSV23) 11/19/2016 (Originally 10/25/1974)   ? Breast Cancer Screening  10/19/2016   ? Preventive Wellness Visit  10/31/2016   ? Hemoglobin A1C  01/22/2017   ? Creatinine  04/27/2017   ? Basic Metabolic Panel  04/27/2017   ? Diabetic Foot Exam  05/18/2017   ? Diabetic Eye Exam  05/18/2017   ? Lipid Profile  07/24/2017   ? Urine Protein  07/24/2017   ? Colon Cancer Screening  05/04/2022   ? USPSTF HIV Risk Assessment  Completed   ? USPSTF Hepatitis C Screening  Completed   ? Influenza Vaccine  Addressed   ? DTaP,Tdap,and Td Vaccines  Excluded     Scribe Attestation: I, Blase MessJasmine Angers, have acted as a Neurosurgeonscribe for Robb MatarJeremy Latron Coleman, MD 9:10 AM 07/24/2016     Physician Attestation: Marland Kitchen***

## 2016-07-27 NOTE — Progress Notes
HPI       Stacey Boyer is a 60 y.o. female with a history of IDDM, lumbar spine OA, GERD, HPLD, HTN, and tobacco use disorder here for Dysuria (Foul odor X 1 week)    [name] is here today with a complaint of dysuria, increased frequency, decreased urinary output, foul smelling urine odor, pelvic pressure and pelvic pain. She reports having a urinary tract infection about 3 months ago when [facility]. The pt states that she has felt severe urgeny, almost to the point of having an incidence of incontinence. She describes her current symptoms as sharp intermittent pelvic pain and a constant pelvic pressure. The pt states that she follows measures to prevent infection and is unsure why she has had reoccuring urinary tract infections. She states that she took three Advil last night, which provided some relief. The pt denies any flank pain.    She has a history of pancreatitis. The pt states that she was given Tramadol in the hospital and was advised to follow a soft diet upon discharge, which provided adequate relief of her mid to low abdominal pain. She states that she had an endoscopy completed (Dr. Lorella NimrodHarvey) and was told that she has GERD and ulcers. The pt states that she has been eating small portions due to feeling full faster and subsequent abdominal pain if she eats too much. She states that she has used OTC stool softeners, which have helped regulate her bowel movements. Otherwise the pt denies any blood in stools.      Pain level and fall risk were assessed. Health issues since the last visit were reviewed.    Past Medical History:   Diagnosis Date   ? Acute pancreatitis, unspecified complication status, unspecified pancreatitis type 04/30/2016   ? Allergy to environmental factors    ? Arthritis    ? Asthma    ? Diabetes mellitus    ? GERD (gastroesophageal reflux disease)    ? Heart murmur    ? Hyperlipidemia    ? Hypertension

## 2016-07-27 NOTE — Progress Notes
?   Polyneuropathy in diabetes(357.2)      Past Surgical History:   Procedure Laterality Date   ? CESAREAN SECTION     ? HERNIA REPAIR     ? HYSTERECTOMY       Family History   Problem Relation Age of Onset   ? High Blood Pressure Mother    ? Cancer Mother      breast and colon   ? Breast Cancer Mother 60   ? Depress51ion Father    ? Heart Disease Father    ? High Blood Pressure Father    ? Cancer Father      brain   ? Cancer Sister      breast   ? Breast Cancer Sister [id number]   ? Cancer Maternal Aunt      throat     Social History   Substance Use Topics   ? Smoking status: Former Smoker   ? Smokeless tobacco: Former NeurosurgeonUser   ? Alcohol use No     Current Outpatient Prescriptions on File Prior to Visit   Medication Sig   ? atorvastatin (LIPITOR) 20 MG Tablet Take 1 tablet by mouth daily.   ? buPROPion (WELLBUTRIN XL) 150 MG Tablet Extended Release 24 Hour Take 1 tablet by mouth every morning.   ? estradiol-norethindrone (COMBIPATCH) 0.05-0.14 MG/DAY Patch Twice Weekly Place 1 patch onto the skin twice a week.   ? hydroCHLOROthiazide (HYDRODIURIL) 25 MG Tablet Take 1 tablet by mouth daily.   ? HYDROcodone-acetaminophen (NORCO) 5-325 MG Tablet Take 1 tablet by mouth every 8 hours as needed for pain. Earliest Fill Date: 11/01/15   ? ibuprofen (ADVIL,MOTRIN) 600 MG Tablet Take 1 Tablet by mouth every 6 hours as needed for pain.   ? insulin aspart (NovoLOG) 100 UNIT/ML Solution Pen-injector Inject 15 Units into the skin 3 times daily (before meals).   ? insulin detemir (LEVEMIR) 100 UNIT/ML Solution Pen-injector Inject 30 Units into the skin nightly at bedtime.   ? [DISCONTINUED] ketorolac (TORADOL) 10 MG Tablet Take 1 tablet by mouth every 6 hours as needed for pain.   ? lisinopril (PRINIVIL,ZESTRIL) 5 MG Tablet Take 1 tablet by mouth daily.   ? NOVOFINE 32G X 6 MM Miscellaneous USE 1 AT BEDTIME AND 3 DAILY BEFORE MEALS   ? omeprazole (PriLOSEC) 40 MG Capsule Delayed Release Take 1 capsule by mouth daily.

## 2016-07-27 NOTE — Progress Notes
Learning/Communication Barriers? No    -CJ at 07/24/16 [id number]       Fall Risk Assessment    Had recent fall / Last 6 months? No recent fall    -CJ at 07/24/16 0902       Does patient have a fear of falling? No    -CJ at 07/24/16 0902         User Key  (r) = Recorded By, (t) = Taken By, (c) = Cosigned By    Initials Name Effective Dates    Colin InaJ Jackson, [place], [place] 12/31/15 -           Physical Exam  Constitution: Well developed, overweight, appears stated age  HEENT: Lids without erythema or lesion, conjunctivae pink, sclera white. Hears soft spoken voice without difficulty  NECK: Supple without stridor   RESPIRATORY: Non labored breathing, symmetric chest wall movement  CV: RRR; no m/g/r, no JVD; no pedal edema   ABD: soft, nondistended, nontender, normoactive bowel sounds  GU: Suprapubic TTP.  MUSCULOSKELETAL: Normal gait, good muscle tone and strength; no swelling, tenderness, limitation of motion of any joint.   SKIN: warm and well perfused.   NEUROLOGIC: CN: II-XII grossly intact.  PSYCH: oriented to time, person, place; normal speech and content; appropriate mood and affect; interactive and responsive; memory, judgment, and insight are intact    CBC (with or without Differential):   Lab Results   Component Value Date    WBC 10.00 04/27/2016    HGB 13.1 04/27/2016    HGB 13.6 05/28/2015    HCT 38.1 04/27/2016    HCT 41.3 05/28/2015    MCV 83.6 04/27/2016    MCV 86.7 05/28/2015    MCH 28.7 04/27/2016    MCH 28.5 05/28/2015    MCHC 34.4 04/27/2016    MCHC 32.9 05/28/2015    RDW 14.6 04/27/2016    RDW 15.8 (H) 05/28/2015    PLATCOUNT 252 04/27/2016    MPV 11.6 (H) 04/27/2016    MPV 10.3 05/28/2015    NEUTROPCT 65.6 04/27/2016    NEUTROPCT 44.4 05/28/2015    LYMPHPCT 44.6 05/28/2015    MONOPCT 6.6 (H) 04/27/2016    MONOPCT 7.3 05/28/2015    EOSPCT 1.2 04/27/2016    EOSPCT 2.9 05/28/2015    BASOPCT 0.3 04/27/2016    BASOPCT 0.8 05/28/2015   , BMP/CMP:   Lab Results   Component Value Date    NA 145 04/27/2016

## 2016-07-27 NOTE — Progress Notes
?   ondansetron (ZOFRAN-ODT) 4 MG Tablet Disintegrating Dissolve 1 tablet in mouth every 6 hours as needed.   ? polyethylene glycol (GLYCOLAX) Powder MIX 2 CAPFUL (34 GM) IN 8 - 16 OUNCES OF WATER, JUICE OR TEA AND DRINK DAILY.   ? varenicline (CHANTIX STARTING MONTH PAK) 0.5 MG X 11 & 1 MG X 42 tablet Take one 0.5mg  tab by mouth once daily x3 days; then increase     No current facility-administered medications on file prior to visit.      Allergies   Allergen Reactions   ? No Known Drug Allergy      No Known Drug Allergies     Review of Systems   Review of Systems   Constitutional: Negative for chills and fever.   Respiratory: Negative for shortness of breath.    Cardiovascular: Negative for chest pain, palpitations and leg swelling.   Gastrointestinal: Negative for diarrhea, nausea and vomiting.   Genitourinary: Positive for decreased urine volume, dysuria, frequency, hematuria, pelvic pain and urgency.   Skin: Negative for rash.   Neurological: Negative for syncope, weakness and numbness.     Physical Exam        VITAL SIGNS (all recorded)      Clinic Vitals       07/24/16 0901             Amb Encounter Vitals    Weight --   pt refused    -CJ at 07/24/16 0902       Height 1.626 m (5\' 4" )    -CJ at 07/24/16 0902       BP 116/81    -CJ at 07/24/16 0904       BP Location Left upper arm    -CJ at 07/24/16 0902       Position Sitting    -CJ at 07/24/16 0902       Pulse 81    -CJ at 07/24/16 0904       Pulse Source Other (Comment);Monitor    -CJ at 07/24/16 0902       Pulse Quality Normal    -CJ at 07/24/16 0902       Resp 17    -CJ at 07/24/16 0904       Respiration Quality Normal    -CJ at 07/24/16 0902       Temp 37.4 ?C (99.3 ?F)    -CJ at 07/24/16 0904       Temperature Source Oral    -CJ at 07/24/16 0902       O2 Saturation 100 %    -CJ at 07/24/16 0904       FiO2 Source RA    -CJ at 07/24/16 0902       Pain Score Two    -CJ at 07/24/16 0902       Education/Communication Barriers?

## 2016-07-27 NOTE — Progress Notes
Reviewed pertinent labs/diagnostics/notes with patient.     Discussed plan of care with patient. Patient is also advised to various treatment options and agreed to the following: taking medication as directed, keeping follow up visits.     Risks verses benefits of medication and treatment were discussed. Patient voiced agreement and understanding.    F/u as scheduled or sooner as needed. Patient advised to be vigilant and to report any worsening symptoms. If concerning symptoms emerge or worsen, please seek treatment at ED.     Goals     ? Exercise 150 minutes per week (moderate activity)     ? HEMOGLOBIN A1C < 7.0     ? Reduce salt intake to 2 grams per day or less           Health Maintenance   Topic Date Due   ? Pneumovax / Prevnar (1 of 1 - PPSV23) 11/19/2016 (Originally 10/25/1974)   ? Breast Cancer Screening  10/19/2016   ? Preventive Wellness Visit  10/31/2016   ? Hemoglobin A1C  01/22/2017   ? Creatinine  04/27/2017   ? Basic Metabolic Panel  04/27/2017   ? Diabetic Foot Exam  05/18/2017   ? Diabetic Eye Exam  05/18/2017   ? Lipid Profile  07/24/2017   ? Urine Protein  07/24/2017   ? Colon Cancer Screening  05/04/2022   ? USPSTF HIV Risk Assessment  Completed   ? USPSTF Hepatitis C Screening  Completed   ? Influenza Vaccine  Addressed   ? DTaP,Tdap,and Td Vaccines  Excluded     Scribe Attestation: I, [employer] All American PipelineJasmine Angers, have acted as a Neurosurgeonscribe for Robb MatarJeremy Latron Coleman, MD 9:10 AM 07/24/2016     Physician Attestation: I have reviewed and confirmed the information stated by the scribe and made corrections and edits as appropriate. Despite these efforts, minor errors may be noted. I have personally provided the services documented by the scribe.   ?  Riki RuskJeremy L. Effie Shyoleman, MD  ?

## 2016-07-29 MED ORDER — CLIMARA 0.025 MG/24HR TD PTWK
1 | MEDICATED_PATCH | TRANSDERMAL | Status: CN
Start: 2016-07-29 — End: ?

## 2016-08-10 DIAGNOSIS — E109 Type 1 diabetes mellitus without complications: Principal | ICD-10-CM

## 2016-08-10 MED ORDER — INSULIN ASPART 100 UNIT/ML SC SOPN
15 [IU] | Freq: Three times a day (TID) | SUBCUTANEOUS | 5 refills | Status: CP
Start: 2016-08-10 — End: ?

## 2016-08-12 DIAGNOSIS — E109 Type 1 diabetes mellitus without complications: Principal | ICD-10-CM

## 2016-08-18 DIAGNOSIS — E119 Type 2 diabetes mellitus without complications: Principal | ICD-10-CM

## 2016-08-18 MED ORDER — LEVEMIR FLEXTOUCH 100 UNIT/ML SC SOPN
5 refills | Status: CP
Start: 2016-08-18 — End: ?

## 2016-09-09 DIAGNOSIS — E119 Type 2 diabetes mellitus without complications: Principal | ICD-10-CM

## 2016-09-11 MED ORDER — LEVEMIR FLEXTOUCH 100 UNIT/ML SC SOPN
5 refills
Start: 2016-09-11 — End: ?

## 2016-09-23 ENCOUNTER — Ambulatory Visit: Attending: Internal Medicine | Primary: Family Medicine

## 2016-09-23 DIAGNOSIS — Z6828 Body mass index (BMI) 28.0-28.9, adult: Secondary | ICD-10-CM

## 2016-09-23 DIAGNOSIS — Z794 Long term (current) use of insulin: Secondary | ICD-10-CM

## 2016-09-23 DIAGNOSIS — N3000 Acute cystitis without hematuria: Secondary | ICD-10-CM

## 2016-09-23 DIAGNOSIS — E785 Hyperlipidemia, unspecified: Secondary | ICD-10-CM

## 2016-09-23 DIAGNOSIS — E119 Type 2 diabetes mellitus without complications: Secondary | ICD-10-CM

## 2016-09-23 DIAGNOSIS — R300 Dysuria: Principal | ICD-10-CM

## 2016-09-23 MED ORDER — NITROFURANTOIN MONOHYD MACRO 100 MG PO CAPS
0 refills | Status: CP
Start: 2016-09-23 — End: 2016-12-07

## 2016-09-23 MED ORDER — SULFAMETHOXAZOLE-TRIMETHOPRIM 800-160 MG PO TABS
1 | ORAL_TABLET | Freq: Two times a day (BID) | ORAL | 0 refills | Status: CP
Start: 2016-09-23 — End: 2016-12-07

## 2016-09-23 NOTE — Progress Notes
?   nitrofurantoin (macrocrystalline) (MACROBID) 100 MG PO Capsule     Sig: Po 1 cap after intercourse     Dispense:  20 capsule     Refill:  0     Orders Placed This Encounter   Procedures   ? Culture, Urine   ? POCT Urinalysis w/out microscopy inhouse-AUTO   ? POCT glycosylated hemoglobin (Hb A1C)       Health Maintenance was reviewed. The patient's HM Topic list was:                                            Health Maintenance   Topic Date Due   ? Pneumovax / Prevnar (1 of 1 - PPSV23) 11/19/2016 (Originally 10/25/1974)   ? Breast Cancer Screening  10/19/2016   ? Preventive Wellness Visit  10/31/2016   ? Hemoglobin A1C  01/22/2017   ? Creatinine  04/27/2017   ? Basic Metabolic Panel  04/27/2017   ? Diabetic Foot Exam  05/18/2017   ? Diabetic Eye Exam  05/18/2017   ? Lipid Profile  07/24/2017   ? Urine Protein  07/24/2017   ? Colon Cancer Screening  05/04/2022   ? USPSTF HIV Risk Assessment  Completed   ? USPSTF Hepatitis C Screening  Completed   ? Influenza Vaccine  Addressed   ? DTaP,Tdap,and Td Vaccines  Excluded

## 2016-09-23 NOTE — Progress Notes
?   atorvastatin (LIPITOR) 20 MG Tablet Take 1 tablet by mouth daily.   ? buPROPion (WELLBUTRIN XL) 150 MG Tablet Extended Release 24 Hour Take 1 tablet by mouth every morning.   ? hydroCHLOROthiazide (HYDRODIURIL) 25 MG Tablet Take 1 tablet by mouth daily.   ? HYDROcodone-acetaminophen (NORCO) 5-325 MG Tablet Take 1 tablet by mouth every 8 hours as needed for pain. Earliest Fill Date: 11/01/15   ? ibuprofen (ADVIL,MOTRIN) 600 MG Tablet Take 1 Tablet by mouth every 6 hours as needed for pain.   ? insulin aspart (NovoLOG) pen Inject 15 Units into the skin 3 times daily (before meals).   ? LEVEMIR FlexTouch INJECT 30 UNITS INTO THE SKIN NIGHTLY AT BEDTIME.   ? lisinopril (PRINIVIL,ZESTRIL) 5 MG Tablet Take 1 tablet by mouth daily.   ? nitrofurantoin (macrocrystalline) (MACROBID) 100 MG Capsule Take 1 capsule by mouth 2 times daily for 7 days.   ? NOVOFINE 32G X 6 MM Miscellaneous USE 1 AT BEDTIME AND 3 DAILY BEFORE MEALS   ? omeprazole (PriLOSEC) 40 MG Capsule Delayed Release Take 1 capsule by mouth daily.   ? ondansetron (ZOFRAN-ODT) 4 MG Tablet Disintegrating Dissolve 1 tablet in mouth every 6 hours as needed.   ? polyethylene glycol (GLYCOLAX) Powder MIX 2 CAPFUL (34 GM) IN 8 - 16 OUNCES OF WATER, JUICE OR TEA AND DRINK DAILY.   ? varenicline (CHANTIX STARTING MONTH PAK) 0.5 MG X 11 & 1 MG X 42 tablet Take one 0.5mg  tab by mouth once daily x3 days; then increase     No current facility-administered medications on file prior to visit.      Allergies   Allergen Reactions   ? No Known Drug Allergy      No Known Drug Allergies         Review of Systems  Review of Systems   Constitutional: Negative for activity change, appetite change, fatigue and unexpected weight change.   HENT: Negative for congestion, rhinorrhea and sore throat.    Eyes: Negative for photophobia, pain and visual disturbance.   Respiratory: Negative for cough, chest tightness, shortness of breath and wheezing.

## 2016-09-23 NOTE — Progress Notes
Subjective:   Stephania FragminMischel Gernert is a 60 y.o. female being seen today for Dysuria (frequent urination)       Dysuria    This is a recurrent problem. The current episode started in the past 7 days. The problem occurs intermittently. The problem has been waxing and waning. The quality of the pain is described as burning. The pain is mild. There has been no fever. Associated symptoms include frequency. Pertinent negatives include no flank pain, hematuria, nausea, urgency or vomiting. Associated symptoms comments: Pt stated that she has UTI like Sx almost every month. .       Past Medical History:   Diagnosis Date   ? Acute pancreatitis, unspecified complication status, unspecified pancreatitis type 04/30/2016   ? Allergy to environmental factors    ? Arthritis    ? Asthma    ? Diabetes mellitus    ? GERD (gastroesophageal reflux disease)    ? Heart murmur    ? Hyperlipidemia    ? Hypertension    ? Polyneuropathy in diabetes(357.2)      Past Surgical History:   Procedure Laterality Date   ? CESAREAN SECTION     ? HERNIA REPAIR     ? HYSTERECTOMY       Family History   Problem Relation Age of Onset   ? High Blood Pressure Mother    ? Cancer Mother      breast and colon   ? Breast Cancer Mother [id number]   ? Depression Father    ? Heart Disease Father    ? High Blood Pressure Father    ? Cancer Father      brain   ? Cancer Sister      breast   ? Breast Cancer Sister [id number]   ? Cancer Maternal Aunt      throat     Social History     Social History   ? Marital status: Married     Spouse name: N/A   ? Number of children: N/A   ? Years of education: N/A     Occupational History   ? Not on file.     Social History Main Topics   ? Smoking status: Former Smoker   ? Smokeless tobacco: Former NeurosurgeonUser   ? Alcohol use No   ? Drug use: No   ? Sexual activity: Not on file     Other Topics Concern   ? Not on file     Social History Narrative     Current Outpatient Prescriptions on File Prior to Visit   Medication Sig

## 2016-09-23 NOTE — Progress Notes
Constitutional: She is oriented to person, place, and time. She appears well-developed and well-nourished. No distress.   HENT:   Head: Normocephalic and atraumatic.   Eyes: No scleral icterus.   Neck: Neck supple.   Abdominal: Soft. She exhibits no distension and no mass. There is no tenderness. There is no rebound and no guarding.   CVA tenderness    Neurological: She is alert and oriented to person, place, and time.   Skin: Skin is warm. She is not diaphoretic.   Psychiatric: She has a normal mood and affect. Her behavior is normal.   Vitals reviewed.       Assessment:       ICD-10-CM ICD-9-CM    1. Dysuria R30.0 788.1 POCT Urinalysis w/out microscopy inhouse-AUTO      sulfamethoxazole-trimethoprim (BACTRIM DS,SEPTRA DS) 800-160 MG PO Tablet      nitrofurantoin (macrocrystalline) (MACROBID) 100 MG PO Capsule   2. Acute cystitis without hematuria N30.00 595.0 Culture, Urine      sulfamethoxazole-trimethoprim (BACTRIM DS,SEPTRA DS) 800-160 MG PO Tablet   3. Type 2 diabetes mellitus without complication, with long-term current use of insulin E11.9 250.00     Z79.4 V58.67    4. Hyperlipidemia, unspecified hyperlipidemia type E78.5 272.4    5. Body mass index 28.0-28.9, adult Z68.28 V85.24           Plan:     A. UTI   1. UA:  Leuko - small   Nitro -pos    Blood-   2. Urine culture   3. Bactrim DS bid    4. Increase fluid intake   5. Start Macrobid 100 mg for postcoital Tx.   B. DM II   1. A1c 6.8 on 09/23/16,    2.continue Levemia,   C. HLD   1. LDL 102 in 04/2015, TG 82 in 04/2015   2. Continue LIpitor      General instructions   1. Bring all the medications on next visit  2. Continue current medications  3. Return to office if no improvement or any problems.       .   Orders Placed This Encounter   Medications   ? sulfamethoxazole-trimethoprim (BACTRIM DS,SEPTRA DS) 800-160 MG PO Tablet     Sig: Take 1 tablet by mouth 2 times daily for 7 days.     Dispense:  14 tablet     Refill:  0

## 2016-09-23 NOTE — Progress Notes
Cardiovascular: Negative for chest pain, palpitations and leg swelling.   Gastrointestinal: Negative for abdominal pain, constipation, diarrhea, nausea and vomiting.   Endocrine: Negative.    Genitourinary: Positive for dysuria and frequency. Negative for flank pain, hematuria and urgency.   Musculoskeletal: Negative for arthralgias, back pain, joint swelling, myalgias, neck pain and neck stiffness.   Skin: Negative for color change, rash and wound.   Allergic/Immunologic: Negative.    Neurological: Negative for dizziness, weakness, numbness and headaches.   Hematological: Negative for adenopathy. Does not bruise/bleed easily.   Psychiatric/Behavioral: Negative for sleep disturbance. The patient is not nervous/anxious.            Objective:        VITAL SIGNS (all recorded)      Clinic Vitals       09/23/16 1151             Amb Encounter Vitals    Weight 74.8 kg (165 lb)    -YF at 09/23/16 1151       Height 1.626 m (5\' 4" )    -YF at 09/23/16 1151       BMI (Calculated) 28.38    -YF at 09/23/16 1151       BSA (Calculated - sq m) 1.84    -YF at 09/23/16 1151       BP (!)  146/91    -YF at 09/23/16 1153       BP Location Left upper arm    -YF at 09/23/16 1152       Position Sitting    -YF at 09/23/16 1152       Pulse 87    -YF at 09/23/16 1152       Resp 18    -YF at 09/23/16 1152       Temp 36.5 ?C (97.7 ?F)    -YF at 09/23/16 1152       Temperature Source Oral    -YF at 09/23/16 1152       O2 Saturation 100 %    -YF at 09/23/16 1152       Pain Score Zero    -YF at 09/23/16 1152       Education/Communication Barriers?    Learning/Communication Barriers? No    -YF at 09/23/16 1152       Fall Risk Assessment    Had recent fall / Last 6 months? No recent fall    -YF at 09/23/16 1152       Does patient have a fear of falling? No    -YF at 09/23/16 1152         User Key  (r) = Recorded By, (t) = Taken By, (c) = Cosigned By    Initials Name Effective Dates    YF Felts, Rolland PorterYarlyn -        Physical Exam

## 2016-10-13 ENCOUNTER — Encounter: Attending: Family Medicine | Primary: Family Medicine

## 2016-11-02 ENCOUNTER — Encounter: Attending: Family Medicine | Primary: Family Medicine

## 2016-11-13 ENCOUNTER — Encounter: Attending: Internal Medicine | Primary: Family Medicine

## 2016-11-16 ENCOUNTER — Encounter: Attending: Family Medicine | Primary: Family Medicine

## 2016-11-23 ENCOUNTER — Ambulatory Visit: Attending: Internal Medicine | Primary: Family Medicine

## 2016-11-23 DIAGNOSIS — K859 Acute pancreatitis without necrosis or infection, unspecified: Secondary | ICD-10-CM

## 2016-11-23 DIAGNOSIS — M542 Cervicalgia: Secondary | ICD-10-CM

## 2016-11-23 DIAGNOSIS — I10 Essential (primary) hypertension: Secondary | ICD-10-CM

## 2016-11-23 DIAGNOSIS — R1010 Upper abdominal pain, unspecified: Secondary | ICD-10-CM

## 2016-11-23 DIAGNOSIS — Z23 Encounter for immunization: Secondary | ICD-10-CM

## 2016-11-23 DIAGNOSIS — R748 Abnormal levels of other serum enzymes: Secondary | ICD-10-CM

## 2016-11-23 DIAGNOSIS — E785 Hyperlipidemia, unspecified: Secondary | ICD-10-CM

## 2016-11-23 DIAGNOSIS — Z9109 Other allergy status, other than to drugs and biological substances: Secondary | ICD-10-CM

## 2016-11-23 DIAGNOSIS — R011 Cardiac murmur, unspecified: Secondary | ICD-10-CM

## 2016-11-23 DIAGNOSIS — E119 Type 2 diabetes mellitus without complications: Secondary | ICD-10-CM

## 2016-11-23 DIAGNOSIS — Z1159 Encounter for screening for other viral diseases: Secondary | ICD-10-CM

## 2016-11-23 DIAGNOSIS — R109 Unspecified abdominal pain: Secondary | ICD-10-CM

## 2016-11-23 DIAGNOSIS — N951 Menopausal and female climacteric states: Secondary | ICD-10-CM

## 2016-11-23 DIAGNOSIS — E1142 Type 2 diabetes mellitus with diabetic polyneuropathy: Secondary | ICD-10-CM

## 2016-11-23 DIAGNOSIS — Z0000 Encounter for general adult medical examination without abnormal findings: Principal | ICD-10-CM

## 2016-11-23 DIAGNOSIS — K219 Gastro-esophageal reflux disease without esophagitis: Secondary | ICD-10-CM

## 2016-11-23 DIAGNOSIS — J45909 Unspecified asthma, uncomplicated: Secondary | ICD-10-CM

## 2016-11-23 DIAGNOSIS — Z6828 Body mass index (BMI) 28.0-28.9, adult: Secondary | ICD-10-CM

## 2016-11-23 DIAGNOSIS — Z794 Long term (current) use of insulin: Secondary | ICD-10-CM

## 2016-11-23 DIAGNOSIS — M1990 Unspecified osteoarthritis, unspecified site: Secondary | ICD-10-CM

## 2016-11-23 DIAGNOSIS — Z13820 Encounter for screening for osteoporosis: Secondary | ICD-10-CM

## 2016-11-23 NOTE — Progress Notes
Subjective:   Stacey Boyer is a 61 y.o. female being seen today for PHYSICAL EXAM     HPI    61 yo AAF with DM Ii, HTN, HLD came in for annual physical examination.   Most recent PE was in 2017  GYN / PAP visit; 2017  [facility]   DEXA scan; long time ago  Colonoscopy : less than 5 years     Past Medical History:   Diagnosis Date   ? Acute pancreatitis, unspecified complication status, unspecified pancreatitis type 04/30/2016   ? Allergy to environmental factors    ? Arthritis    ? Asthma    ? Diabetes mellitus    ? GERD (gastroesophageal reflux disease)    ? Heart murmur    ? Hyperlipidemia    ? Hypertension    ? Polyneuropathy in diabetes(357.2)      Past Surgical History:   Procedure Laterality Date   ? CESAREAN SECTION     ? HERNIA REPAIR     ? HYSTERECTOMY       Family History   Problem Relation Age of Onset   ? High Blood Pressure Mother    ? Cancer Mother      breast and colon   ? Breast Cancer Mother [id number]   ? Depression Father    ? Heart Disease Father    ? High Blood Pressure Father    ? Cancer Father      brain   ? Cancer Sister      breast   ? Breast Cancer Sister [id number]   ? Cancer Maternal Aunt      throat     Social History     Social History   ? Marital status: Married     Spouse name: N/A   ? Number of children: N/A   ? Years of education: N/A     Occupational History   ? Not on file.     Social History Main Topics   ? Smoking status: Former Smoker   ? Smokeless tobacco: Former NeurosurgeonUser   ? Alcohol use No   ? Drug use: No   ? Sexual activity: Not on file     Other Topics Concern   ? Not on file     Social History Narrative     Current Outpatient Prescriptions on File Prior to Visit   Medication Sig   ? atorvastatin (LIPITOR) 20 MG Tablet Take 1 tablet by mouth daily.   ? buPROPion (WELLBUTRIN XL) 150 MG Tablet Extended Release 24 Hour Take 1 tablet by mouth every morning.   ? hydroCHLOROthiazide (HYDRODIURIL) 25 MG Tablet Take 1 tablet by mouth daily.

## 2016-11-23 NOTE — Progress Notes
?   HYDROcodone-acetaminophen (NORCO) 5-325 MG Tablet Take 1 tablet by mouth every 8 hours as needed for pain. Earliest Fill Date: 11/01/15   ? ibuprofen (ADVIL,MOTRIN) 600 MG Tablet Take 1 Tablet by mouth every 6 hours as needed for pain.   ? insulin aspart (NovoLOG) pen Inject 15 Units into the skin 3 times daily (before meals).   ? LEVEMIR FlexTouch INJECT 30 UNITS INTO THE SKIN NIGHTLY AT BEDTIME.   ? lisinopril (PRINIVIL,ZESTRIL) 5 MG Tablet Take 1 tablet by mouth daily.   ? nitrofurantoin (macrocrystalline) (MACROBID) 100 MG Capsule Take 1 capsule by mouth 2 times daily for 7 days.   ? nitrofurantoin (macrocrystalline) (MACROBID) 100 MG PO Capsule Po 1 cap after intercourse   ? NOVOFINE 32G X 6 MM Miscellaneous USE 1 AT BEDTIME AND 3 DAILY BEFORE MEALS   ? omeprazole (PriLOSEC) 40 MG Capsule Delayed Release Take 1 capsule by mouth daily.   ? ondansetron (ZOFRAN-ODT) 4 MG Tablet Disintegrating Dissolve 1 tablet in mouth every 6 hours as needed.   ? polyethylene glycol (GLYCOLAX) Powder MIX 2 CAPFUL (34 GM) IN 8 - 16 OUNCES OF WATER, JUICE OR TEA AND DRINK DAILY.   ? sulfamethoxazole-trimethoprim (BACTRIM DS,SEPTRA DS) 800-160 MG PO Tablet Take 1 tablet by mouth 2 times daily for 7 days.   ? varenicline (CHANTIX STARTING MONTH PAK) 0.5 MG X 11 & 1 MG X 42 tablet Take one 0.5mg  tab by mouth once daily x3 days; then increase     No current facility-administered medications on file prior to visit.      Allergies   Allergen Reactions   ? No Known Drug Allergy      No Known Drug Allergies         Review of Systems  Review of Systems   Constitutional: Negative for activity change, appetite change, fatigue and unexpected weight change.   HENT: Negative for congestion, rhinorrhea and sore throat.    Eyes: Negative for photophobia, pain and visual disturbance.   Respiratory: Negative for cough, chest tightness, shortness of breath and wheezing.    Cardiovascular: Negative for chest pain, palpitations and leg swelling.

## 2016-11-23 NOTE — Progress Notes
has normal reflexes. She displays normal reflexes. No cranial nerve deficit. She exhibits normal muscle tone. Coordination normal.   Skin: Skin is warm. No rash noted. She is not diaphoretic. No pallor.   Breasts: breasts appear normal, no suspicious masses, no skin or nipple changes or axillary nodes   Psychiatric: She has a normal mood and affect. Her behavior is normal.   Vitals reviewed.       Assessment:       ICD-10-CM ICD-9-CM    1. Routine general medical examination at a health care facility Z00.00 V70.0 CBC and Differential      Comprehensive Metabolic Panel      Lipid Panel      TSH+Free T4      Urinalysis W Microscopy   2. Encounter for hepatitis C screening test for low risk patient Z11.59 V73.89 Hepatitis C Antibody   3. Type 2 diabetes mellitus without complication, with long-term current use of insulin E11.9 250.00 Comprehensive Metabolic Panel    Z79.4 V58.67 HEMOGLOBIN A1C      MICROALBUMIN URINE RANDOM          Plan:     A. Annual physical examination    1, CBC, CMP, TSH, Lipid, UA, A1c, Urine microalbumin, Hep C   2. Mammogram  Tomorrow    3. DEXA scan   4. Pneumococcal -13 today   5. Colonoscopy : UTD, less than 5 years    No orders of the following type(s) were placed in this encounter: Medications.     No orders of the following type(s) were placed in this encounter: Procedures      Health Maintenance was reviewed. The patient's HM Topic list was:                                            Health Maintenance   Topic Date Due   ? Pneumovax / Prevnar (1 of 1 - PPSV23) 10/25/1976   ? Breast Cancer Screening  10/19/2016   ? Preventive Wellness Visit  10/31/2016   ? Hemoglobin A1C  03/24/2017   ? Creatinine  04/27/2017   ? Basic Metabolic Panel  04/27/2017   ? Diabetic Foot Exam  05/18/2017   ? Diabetic Eye Exam  05/18/2017   ? Lipid Profile  07/24/2017   ? Urine Microalbumin  07/24/2017   ? Zoster Vaccine (1) 10/25/2017   ? Colon Cancer Screening  05/04/2022

## 2016-11-23 NOTE — Progress Notes
?   USPSTF HIV Risk Assessment  Completed   ? USPSTF Hepatitis C Screening  Completed   ? Influenza Vaccine  Addressed   ? DTaP,Tdap,and Td Vaccines  Excluded

## 2016-11-23 NOTE — Progress Notes
Reason:  Physician ordered labs  Amount:  5 Tubes  Type:  Butterfly  Site:  Vein  right arm  Reaction:  None    Draw performed by:  [id number],  11/23/2016

## 2016-11-23 NOTE — Progress Notes
Gastrointestinal: Negative for abdominal pain, constipation, diarrhea, nausea and vomiting.   Endocrine: Negative.    Genitourinary: Negative for dysuria, flank pain, frequency, hematuria and urgency.   Musculoskeletal: Negative for arthralgias, back pain, joint swelling, myalgias, neck pain and neck stiffness.   Skin: Negative for color change, rash and wound.   Allergic/Immunologic: Negative.    Neurological: Negative for dizziness, weakness, numbness and headaches.   Hematological: Negative for adenopathy. Does not bruise/bleed easily.   Psychiatric/Behavioral: Negative for sleep disturbance. The patient is not nervous/anxious.            Objective:       Physical Exam   Constitutional: She is oriented to person, place, and time. She appears well-developed and well-nourished. No distress.   HENT:   Head: Normocephalic and atraumatic.   Right Ear: External ear normal.   Left Ear: External ear normal.   Nose: Nose normal.   Mouth/Throat: Oropharynx is clear and moist. No oropharyngeal exudate.   Eyes: Conjunctivae and EOM are normal. Pupils are equal, round, and reactive to light. Right eye exhibits no discharge. Left eye exhibits no discharge. No scleral icterus.   Neck: Normal range of motion. Neck supple. No JVD present. No tracheal deviation present. No thyromegaly present.   Cardiovascular: Normal rate, regular rhythm, normal heart sounds and intact distal pulses.  Exam reveals no gallop and no friction rub.    No murmur heard.  Pulmonary/Chest: Effort normal and breath sounds normal. No stridor. No respiratory distress. She has no wheezes. She has no rales. She exhibits no tenderness.   Abdominal: Soft. Bowel sounds are normal. She exhibits no distension. There is no tenderness.   Musculoskeletal: Normal range of motion. She exhibits no edema or deformity.   Lymphadenopathy:     She has no cervical adenopathy.   Neurological: She is alert and oriented to person, place, and time. She

## 2016-12-07 ENCOUNTER — Ambulatory Visit: Attending: Internal Medicine | Primary: Family Medicine

## 2016-12-07 DIAGNOSIS — M1990 Unspecified osteoarthritis, unspecified site: Secondary | ICD-10-CM

## 2016-12-07 DIAGNOSIS — E119 Type 2 diabetes mellitus without complications: Secondary | ICD-10-CM

## 2016-12-07 DIAGNOSIS — E785 Hyperlipidemia, unspecified: Secondary | ICD-10-CM

## 2016-12-07 DIAGNOSIS — I10 Essential (primary) hypertension: Principal | ICD-10-CM

## 2016-12-07 DIAGNOSIS — Z794 Long term (current) use of insulin: Secondary | ICD-10-CM

## 2016-12-07 DIAGNOSIS — R011 Cardiac murmur, unspecified: Secondary | ICD-10-CM

## 2016-12-07 DIAGNOSIS — K219 Gastro-esophageal reflux disease without esophagitis: Secondary | ICD-10-CM

## 2016-12-07 DIAGNOSIS — M542 Cervicalgia: Secondary | ICD-10-CM

## 2016-12-07 DIAGNOSIS — Z9109 Other allergy status, other than to drugs and biological substances: Secondary | ICD-10-CM

## 2016-12-07 DIAGNOSIS — E1142 Type 2 diabetes mellitus with diabetic polyneuropathy: Secondary | ICD-10-CM

## 2016-12-07 DIAGNOSIS — Z6828 Body mass index (BMI) 28.0-28.9, adult: Secondary | ICD-10-CM

## 2016-12-07 DIAGNOSIS — J45909 Unspecified asthma, uncomplicated: Secondary | ICD-10-CM

## 2016-12-07 DIAGNOSIS — K859 Acute pancreatitis without necrosis or infection, unspecified: Secondary | ICD-10-CM

## 2016-12-07 MED ORDER — HYDROCHLOROTHIAZIDE 25 MG PO TABS
25 mg | Freq: Every day | ORAL | 1 refills | Status: CP
Start: 2016-12-07 — End: 2016-12-08

## 2016-12-07 MED ORDER — EZETIMIBE 10 MG PO TABS
10 mg | Freq: Every day | ORAL | 0 refills | Status: CP
Start: 2016-12-07 — End: 2017-01-04

## 2016-12-07 MED ORDER — TIZANIDINE HCL 4 MG PO TABS
4 mg | Freq: Every evening | ORAL | 2 refills | Status: CP | PRN
Start: 2016-12-07 — End: 2017-11-09

## 2016-12-07 NOTE — Progress Notes
Respiratory: Negative for cough, chest tightness, shortness of breath and wheezing.    Cardiovascular: Negative for chest pain, palpitations and leg swelling.   Gastrointestinal: Negative for abdominal pain, constipation, diarrhea, nausea and vomiting.   Endocrine: Negative.    Genitourinary: Negative for dysuria, flank pain, frequency, hematuria and urgency.   Musculoskeletal: Positive for neck pain and neck stiffness. Negative for arthralgias, back pain, joint swelling and myalgias.   Skin: Negative for color change, rash and wound.   Allergic/Immunologic: Negative.    Neurological: Positive for headaches. Negative for dizziness, weakness and numbness.   Hematological: Negative for adenopathy. Does not bruise/bleed easily.   Psychiatric/Behavioral: Negative for sleep disturbance. The patient is not nervous/anxious.            Objective:        VITAL SIGNS (all recorded)      Clinic Vitals       12/07/16 1122             Amb Encounter Vitals    Weight 74.8 kg (165 lb)    -DW at 12/07/16 1124       Height 1.626 m (5\' 4" )    -DW at 12/07/16 1124       BMI (Calculated) 28.38    -DW at 12/07/16 1124       BSA (Calculated - sq m) 1.84    -DW at 12/07/16 1124       BP (!)  145/95    -DW at 12/07/16 1124       BP Location Left upper arm    -DW at 12/07/16 1124       Position Sitting    -DW at 12/07/16 1124       Pulse 85    -DW at 12/07/16 1124       Pulse Source Radial    -DW at 12/07/16 1124       Pulse Quality Normal    -DW at 12/07/16 1124       Resp 16    -DW at 12/07/16 1124       Respiration Quality Normal    -DW at 12/07/16 1124       Temp 36.7 ?C (98.1 ?F)    -DW at 12/07/16 1124       Temperature Source Oral    -DW at 12/07/16 1124       O2 Saturation 94 %    -DW at 12/07/16 1124       Education/Communication Barriers?    Learning/Communication Barriers? No    -DW at 12/07/16 1124       Fall Risk Assessment    Had recent fall / Last 6 months? No recent fall    -DW at 12/07/16 1124

## 2016-12-07 NOTE — Progress Notes
radiate. The pain quality is not similar to prior headaches. The pain is moderate. Associated symptoms include neck pain. Pertinent negatives include no abdominal pain, back pain, coughing, dizziness, eye pain, nausea, numbness, photophobia, rhinorrhea, sore throat, vomiting or weakness. She has tried nothing for the symptoms. (S/p cervical surgery )       Past Medical History:   Diagnosis Date   ? Acute pancreatitis, unspecified complication status, unspecified pancreatitis type 04/30/2016   ? Allergy to environmental factors    ? Arthritis    ? Asthma    ? Diabetes mellitus    ? GERD (gastroesophageal reflux disease)    ? Heart murmur    ? Hyperlipidemia    ? Hypertension    ? Polyneuropathy in diabetes(357.2)      Past Surgical History:   Procedure Laterality Date   ? CESAREAN SECTION     ? HERNIA REPAIR     ? HYSTERECTOMY       Family History   Problem Relation Age of Onset   ? High Blood Pressure Mother    ? Cancer Mother      breast and colon   ? Breast Cancer Mother [id number]   ? Depression Father    ? Heart Disease Father    ? High Blood Pressure Father    ? Cancer Father      brain   ? Cancer Sister      breast   ? Breast Cancer Sister [id number]   ? Cancer Maternal Aunt      throat     Social History     Social History   ? Marital status: Married     Spouse name: N/A   ? Number of children: N/A   ? Years of education: N/A     Occupational History   ? Not on file.     Social History Main Topics   ? Smoking status: Former Smoker   ? Smokeless tobacco: Former NeurosurgeonUser   ? Alcohol use No   ? Drug use: No   ? Sexual activity: Not on file     Other Topics Concern   ? Not on file     Social History Narrative     Current Outpatient Prescriptions on File Prior to Visit   Medication Sig   ? [DISCONTINUED] atorvastatin (LIPITOR) 20 MG Tablet Take 1 tablet by mouth daily.   ? buPROPion (WELLBUTRIN XL) 150 MG Tablet Extended Release 24 Hour Take 1 tablet by mouth every morning.

## 2016-12-07 NOTE — Progress Notes
? [  DISCONTINUED] hydroCHLOROthiazide (HYDRODIURIL) 25 MG Tablet Take 1 tablet by mouth daily.   ? [DISCONTINUED] HYDROcodone-acetaminophen (NORCO) 5-325 MG Tablet Take 1 tablet by mouth every 8 hours as needed for pain. Earliest Fill Date: 11/01/15   ? ibuprofen (ADVIL,MOTRIN) 600 MG Tablet Take 1 Tablet by mouth every 6 hours as needed for pain.   ? insulin aspart (NovoLOG) pen Inject 15 Units into the skin 3 times daily (before meals).   ? LEVEMIR FlexTouch INJECT 30 UNITS INTO THE SKIN NIGHTLY AT BEDTIME.   ? lisinopril (PRINIVIL,ZESTRIL) 5 MG Tablet Take 1 tablet by mouth daily.   ? [DISCONTINUED] nitrofurantoin (macrocrystalline) (MACROBID) 100 MG PO Capsule Po 1 cap after intercourse   ? NOVOFINE 32G X 6 MM Miscellaneous USE 1 AT BEDTIME AND 3 DAILY BEFORE MEALS   ? omeprazole (PriLOSEC) 40 MG Capsule Delayed Release Take 1 capsule by mouth daily.   ? ondansetron (ZOFRAN-ODT) 4 MG Tablet Disintegrating Dissolve 1 tablet in mouth every 6 hours as needed.   ? polyethylene glycol (GLYCOLAX) Powder MIX 2 CAPFUL (34 GM) IN 8 - 16 OUNCES OF WATER, JUICE OR TEA AND DRINK DAILY.   ? varenicline (CHANTIX STARTING MONTH PAK) 0.5 MG X 11 & 1 MG X 42 tablet Take one 0.5mg  tab by mouth once daily x3 days; then increase   ? [DISCONTINUED] nitrofurantoin (macrocrystalline) (MACROBID) 100 MG Capsule Take 1 capsule by mouth 2 times daily for 7 days.   ? [DISCONTINUED] sulfamethoxazole-trimethoprim (BACTRIM DS,SEPTRA DS) 800-160 MG PO Tablet Take 1 tablet by mouth 2 times daily for 7 days.     No current facility-administered medications on file prior to visit.      Allergies   Allergen Reactions   ? No Known Drug Allergy      No Known Drug Allergies         Review of Systems  Review of Systems   Constitutional: Negative for activity change, appetite change, fatigue and unexpected weight change.   HENT: Negative for congestion, rhinorrhea and sore throat.    Eyes: Negative for photophobia, pain and visual disturbance.

## 2016-12-07 NOTE — Progress Notes
Subjective:   Stacey Boyer is a 61 y.o. female being seen today for Follow-up (test result)       Hypertension   This is a chronic problem. The current episode started more than 1 year ago. The problem is unchanged. The problem is controlled. Associated symptoms include headaches and neck pain. Pertinent negatives include no chest pain, palpitations or shortness of breath. There are no associated agents to hypertension. Risk factors for coronary artery disease include diabetes mellitus, dyslipidemia and obesity. Treatments tried: Lisinopril  The current treatment provides moderate improvement. There are no compliance problems.    Hyperlipidemia   This is a chronic problem. The current episode started more than 1 year ago. There are no known factors aggravating her hyperlipidemia. Pertinent negatives include no chest pain, myalgias or shortness of breath. She is currently on no antihyperlipidemic treatment (was on Lipitor but stopped. ). Risk factors for coronary artery disease include diabetes mellitus, dyslipidemia, hypertension and obesity.   Diabetes   She presents for her follow-up diabetic visit. She has type 2 diabetes mellitus. Hypoglycemia symptoms include headaches. Pertinent negatives for hypoglycemia include no dizziness or nervousness/anxiousness. There are no diabetic associated symptoms. Pertinent negatives for diabetes include no chest pain, no fatigue and no weakness. There are no hypoglycemic complications. There are no diabetic complications. Current diabetic treatment includes insulin injections (Novolog ). She is compliant with treatment all of the time. An ACE inhibitor/angiotensin II receptor blocker is being taken.   Headache    This is a chronic problem. The current episode started more than 1 year ago. The problem occurs intermittently. The problem has been waxing and waning. The pain is located in the occipital region. The pain does not

## 2016-12-07 NOTE — Progress Notes
Does patient have a fear of falling? No    -DW at 12/07/16 1124         User Key  (r) = Recorded By, (t) = Taken By, (c) = Cosigned By    Initials Name Effective Dates    DW Heloise PurpuraWheeler, Deborah, MA 12/31/15 -         Physical Exam   Constitutional: She is oriented to person, place, and time. She appears well-developed and well-nourished. No distress.   HENT:   Head: Normocephalic and atraumatic.   Eyes: Conjunctivae are normal.   Neck: Neck supple.   No carotid bruits   Cardiovascular: Normal rate, regular rhythm, normal heart sounds and intact distal pulses.  Exam reveals no gallop and no friction rub.    No murmur heard.  Pulmonary/Chest: Effort normal. No respiratory distress. She has no wheezes. She has no rales. She exhibits no tenderness.   Musculoskeletal: She exhibits tenderness.   Tenderness along B/l Trapezius muscles.    Neurological: She is alert and oriented to person, place, and time.   Skin: Skin is warm. She is not diaphoretic.   Psychiatric: She has a normal mood and affect. Her behavior is normal.   Vitals reviewed.       Assessment:       ICD-10-CM ICD-9-CM    1. Essential hypertension with goal blood pressure less than 130/85 I10 401.9 hydroCHLOROthiazide (HYDRODIURIL) 25 MG PO Tablet   2. Type 2 diabetes mellitus without complication, with long-term current use of insulin E11.9 250.00     Z79.4 V58.67    3. Hyperlipidemia, unspecified hyperlipidemia type E78.5 272.4 ezetimibe (ZETIA) 10 MG PO Tablet   4. Cervicalgia M54.2 723.1 tiZANidine (ZANAFLEX) 4 MG PO Tablet      Refer to External Provider   5. Body mass index 28.0-28.9, adult Z68.28 V85.24    6. Essential hypertension with goal blood pressure less than 140/90 I10 401.9           Plan:       A. HTN.           1. Fairly controlled   2. Continue Lisinopril , HCTZ   B. DM II          1. A1c 6.0 in 11/2016, 6.8 on 09/23/16, FBS 61 in 11/2016            2.continue Novolog   C. HLD         1. LDL 204 in 11/2016, 102 in 6/38758/2016, TG 123 in 11/2016, 82 in

## 2016-12-07 NOTE — Progress Notes
04/2015             2. LIpitor caused severe myalgia   3. Start Zetia 10 mg   D. Anxiety / depression   1. controlled   2. Continue Wellbutrin   E. Cervicalgia / HA   1. S/p surgery    2. Start zanaflex 4 mg qhs   3. Refer to PT   ?   General instructions   1. Bring all the medications on next visit  2. Continue current medications  3. Return to office if no improvement or any problems.     Orders Placed This Encounter   Medications   ? ezetimibe (ZETIA) 10 MG PO Tablet     Sig: Take 1 tablet by mouth daily.     Dispense:  30 tablet     Refill:  0   ? tiZANidine (ZANAFLEX) 4 MG PO Tablet     Sig: Take 1 tablet by mouth nightly at bedtime as needed.     Dispense:  30 tablet     Refill:  2   ? hydroCHLOROthiazide (HYDRODIURIL) 25 MG PO Tablet     Sig: Take 1 tablet by mouth daily.     Dispense:  90 tablet     Refill:  1     Orders Placed This Encounter   Procedures   ? Refer to External Provider       Health Maintenance was reviewed. The patient's HM Topic list was:                                            Health Maintenance   Topic Date Due   ? Breast Cancer Screening  03/12/2017 (Originally 10/19/2016)   ? Chest CT / LDCT  03/12/2017 (Originally 10/25/2012)   ? Pneumovax / Prevnar (1 of 1 - PPSV23) 01/18/2017   ? Diabetic Foot Exam  05/18/2017   ? Diabetic Eye Exam  05/18/2017   ? Hemoglobin A1C  05/23/2017   ? Zoster Vaccine (1) 10/25/2017   ? Preventive Wellness Visit  11/23/2017   ? Lipid Profile  11/23/2017   ? Urine Microalbumin  11/23/2017   ? Creatinine  11/23/2017   ? Basic Metabolic Panel  11/23/2017   ? Colon Cancer Screening  05/04/2022   ? USPSTF HIV Risk Assessment  Completed   ? USPSTF Hepatitis C Screening  Completed   ? Influenza Vaccine  Addressed   ? DTaP,Tdap,and Td Vaccines  Excluded

## 2016-12-08 MED ORDER — HYDROCHLOROTHIAZIDE 25 MG PO TABS
25 mg | Freq: Every day | ORAL | 1 refills | Status: CP
Start: 2016-12-08 — End: 2017-06-04

## 2017-01-04 DIAGNOSIS — E785 Hyperlipidemia, unspecified: Principal | ICD-10-CM

## 2017-01-04 MED ORDER — EZETIMIBE 10 MG PO TABS
1 refills | Status: CP
Start: 2017-01-04 — End: ?

## 2017-01-07 ENCOUNTER — Encounter: Attending: Internal Medicine | Primary: Family Medicine

## 2017-01-11 ENCOUNTER — Ambulatory Visit: Attending: Internal Medicine | Primary: Family Medicine

## 2017-01-11 DIAGNOSIS — R1033 Periumbilical pain: Principal | ICD-10-CM

## 2017-01-11 DIAGNOSIS — E785 Hyperlipidemia, unspecified: Secondary | ICD-10-CM

## 2017-01-11 DIAGNOSIS — E1142 Type 2 diabetes mellitus with diabetic polyneuropathy: Secondary | ICD-10-CM

## 2017-01-11 DIAGNOSIS — M542 Cervicalgia: Secondary | ICD-10-CM

## 2017-01-11 DIAGNOSIS — Z794 Long term (current) use of insulin: Secondary | ICD-10-CM

## 2017-01-11 DIAGNOSIS — Z9109 Other allergy status, other than to drugs and biological substances: Secondary | ICD-10-CM

## 2017-01-11 DIAGNOSIS — F419 Anxiety disorder, unspecified: Secondary | ICD-10-CM

## 2017-01-11 DIAGNOSIS — Z6828 Body mass index (BMI) 28.0-28.9, adult: Secondary | ICD-10-CM

## 2017-01-11 DIAGNOSIS — I10 Essential (primary) hypertension: Secondary | ICD-10-CM

## 2017-01-11 DIAGNOSIS — E119 Type 2 diabetes mellitus without complications: Secondary | ICD-10-CM

## 2017-01-11 DIAGNOSIS — M1990 Unspecified osteoarthritis, unspecified site: Secondary | ICD-10-CM

## 2017-01-11 DIAGNOSIS — K5900 Constipation, unspecified: Secondary | ICD-10-CM

## 2017-01-11 DIAGNOSIS — K219 Gastro-esophageal reflux disease without esophagitis: Secondary | ICD-10-CM

## 2017-01-11 DIAGNOSIS — F329 Major depressive disorder, single episode, unspecified: Secondary | ICD-10-CM

## 2017-01-11 DIAGNOSIS — J45909 Unspecified asthma, uncomplicated: Secondary | ICD-10-CM

## 2017-01-11 DIAGNOSIS — K859 Acute pancreatitis without necrosis or infection, unspecified: Secondary | ICD-10-CM

## 2017-01-11 DIAGNOSIS — R011 Cardiac murmur, unspecified: Secondary | ICD-10-CM

## 2017-01-11 NOTE — Progress Notes
3. Return to office if no improvement or any problems.     No orders of the following type(s) were placed in this encounter: Medications.     No orders of the following type(s) were placed in this encounter: Procedures      Health Maintenance was reviewed. The patient's HM Topic list was:                                            Health Maintenance   Topic Date Due   ? Pneumovax / Prevnar (1 of 1 - PPSV23) 01/18/2017   ? Breast Cancer Screening  03/12/2017 (Originally 10/19/2016)   ? Chest CT / LDCT  03/12/2017 (Originally 10/25/2012)   ? Diabetic Foot Exam  05/18/2017   ? Diabetic Eye Exam  05/18/2017   ? Hemoglobin A1C  05/23/2017   ? Zoster Vaccine (1) 10/25/2017   ? Preventive Wellness Visit  11/23/2017   ? Lipid Profile  11/23/2017   ? Urine Microalbumin  11/23/2017   ? Creatinine  11/23/2017   ? Basic Metabolic Panel  11/23/2017   ? Colon Cancer Screening  05/04/2022   ? USPSTF HIV Risk Assessment  Completed   ? USPSTF Hepatitis C Screening  Completed   ? Influenza Vaccine  Addressed   ? DTaP,Tdap,and Td Vaccines  Excluded

## 2017-01-11 NOTE — Progress Notes
well-developed and well-nourished. No distress.   HENT:   Head: Normocephalic and atraumatic.   Eyes: No scleral icterus.   Neck: Neck supple.   Abdominal: Soft. She exhibits no distension and no mass. There is tenderness. There is no rebound and no guarding.   Hypoactive BS, diffuse tenderness at periumbilical area   Neurological: She is alert and oriented to person, place, and time.   Skin: Skin is warm. She is not diaphoretic.   Psychiatric: She has a normal mood and affect. Her behavior is normal.   Vitals reviewed.       Assessment:       ICD-10-CM ICD-9-CM    1. Periumbilical abdominal pain R10.33 789.05    2. Constipation, unspecified constipation type K59.00 564.00    3. Essential hypertension with goal blood pressure less than 130/85 I10 401.9    4. Type 2 diabetes mellitus without complication, with long-term current use of insulin E11.9 250.00     Z79.4 V58.67    5. Hyperlipidemia, unspecified hyperlipidemia type E78.5 272.4    6. Anxiety and depression F41.9 300.00     F32.9 311    7. Cervicalgia M54.2 723.1    8. Body mass index 28.0-28.9, adult Z68.28 V85.24           Plan:     A. Periumbilical pain   1. 2 ro severe constipation    2. Pt tried Dulcolax, MOM   3. 4 tablets of Bisacodyl 5 mg with plenty fluid , if no improvement in 12 hours, add Miralax. 328 gram .   B. HTN.   ???????  1. Fairly controlled           2. Continue Lisinopril , HCTZ   C. DM II  ????? 1. A1c 6.0 in 11/2016, 6.8 on 09/23/16, FBS 61 in 11/2016  ???????? 2.continue Novolog   C. HLD  ?????? 1. LDL 204 in 11/2016, 102 in 09/6107, TG 123 in 11/2016, 82 in 04/2015  ?????????? 2. LIpitor caused severe myalgia          3. Start Zetia 10 mg   D. Anxiety / depression          1. controlled          2. Continue Wellbutrin   E. Cervicalgia / HA          1. S/p surgery           2. Start zanaflex 4 mg qhs         3. Refer to PT   ??  ?General instructions   1. Bring all the medications on next visit  2. Continue current medications

## 2017-01-11 NOTE — Progress Notes
Subjective:   Stacey Boyer is a 61 y.o. female being seen today for Abdominal Pain       Abdominal Pain   This is a new problem. The current episode started in the past 7 days. The problem occurs intermittently. The problem has been waxing and waning. The pain is located in the periumbilical region. The pain is moderate. The quality of the pain is aching. The abdominal pain does not radiate. Associated symptoms include constipation, headaches and nausea. Pertinent negatives include no arthralgias, diarrhea, dysuria, frequency, hematuria, myalgias or vomiting. Associated symptoms comments: Gassy, bloating. The pain is aggravated by eating. The pain is relieved by nothing. Treatments tried: protonix. The treatment provided mild relief.     Hypertension   This is a chronic problem. The current episode started more than 1 year ago. The problem is unchanged. The problem is controlled. Associated symptoms include headaches and neck pain. Pertinent negatives include no chest pain, palpitations or shortness of breath. There are no associated agents to hypertension. Risk factors for coronary artery disease include diabetes mellitus, dyslipidemia and obesity. Treatments tried: Lisinopril  The current treatment provides moderate improvement. There are no compliance problems.      Hyperlipidemia   This is a chronic problem. The current episode started more than 1 year ago. There are no known factors aggravating her hyperlipidemia. Pertinent negatives include no chest pain, myalgias or shortness of breath. She is currently on no antihyperlipidemic treatment (was on Lipitor but stopped. ). Risk factors for coronary artery disease include diabetes mellitus, dyslipidemia, hypertension and obesity.     Diabetes   She presents for her follow-up diabetic visit. She has type 2 diabetes mellitus. Hypoglycemia symptoms include headaches. Pertinent negatives for

## 2017-01-11 NOTE — Progress Notes
?   ezetimibe (ZETIA) 10 MG PO Tablet TAKE 1 TABLET BY MOUTH EVERY DAY   ? hydroCHLOROthiazide (HYDRODIURIL) 25 MG PO Tablet Take 1 tablet by mouth daily.   ? ibuprofen (ADVIL,MOTRIN) 600 MG Tablet Take 1 Tablet by mouth every 6 hours as needed for pain.   ? insulin aspart (NovoLOG) pen Inject 15 Units into the skin 3 times daily (before meals).   ? LEVEMIR FlexTouch INJECT 30 UNITS INTO THE SKIN NIGHTLY AT BEDTIME.   ? lisinopril (PRINIVIL,ZESTRIL) 5 MG Tablet Take 1 tablet by mouth daily.   ? NOVOFINE 32G X 6 MM Miscellaneous USE 1 AT BEDTIME AND 3 DAILY BEFORE MEALS   ? omeprazole (PriLOSEC) 40 MG Capsule Delayed Release Take 1 capsule by mouth daily.   ? ondansetron (ZOFRAN-ODT) 4 MG Tablet Disintegrating Dissolve 1 tablet in mouth every 6 hours as needed.   ? polyethylene glycol (GLYCOLAX) Powder MIX 2 CAPFUL (34 GM) IN 8 - 16 OUNCES OF WATER, JUICE OR TEA AND DRINK DAILY.   ? tiZANidine (ZANAFLEX) 4 MG PO Tablet Take 1 tablet by mouth nightly at bedtime as needed.   ? varenicline (CHANTIX STARTING MONTH PAK) 0.5 MG X 11 & 1 MG X 42 tablet Take one 0.5mg  tab by mouth once daily x3 days; then increase     No current facility-administered medications on file prior to visit.      Allergies   Allergen Reactions   ? No Known Drug Allergy      No Known Drug Allergies         Review of Systems  Review of Systems   Constitutional: Negative for activity change, appetite change, fatigue and unexpected weight change.   HENT: Negative for congestion, rhinorrhea and sore throat.    Eyes: Negative for photophobia, pain and visual disturbance.   Respiratory: Negative for cough, chest tightness, shortness of breath and wheezing.    Cardiovascular: Negative for chest pain, palpitations and leg swelling.   Gastrointestinal: Positive for abdominal pain, constipation and nausea. Negative for diarrhea and vomiting.   Endocrine: Negative.    Genitourinary: Negative for dysuria, flank pain, frequency, hematuria and urgency.

## 2017-01-11 NOTE — Progress Notes
hypoglycemia include no dizziness or nervousness/anxiousness. There are no diabetic associated symptoms. Pertinent negatives for diabetes include no chest pain, no fatigue and no weakness. There are no hypoglycemic complications. There are no diabetic complications. Current diabetic treatment includes insulin injections (Novolog ). She is compliant with treatment all of the time. An ACE inhibitor/angiotensin II receptor blocker is being taken.           Past Medical History:   Diagnosis Date   ? Acute pancreatitis, unspecified complication status, unspecified pancreatitis type 04/30/2016   ? Allergy to environmental factors    ? Arthritis    ? Asthma    ? Diabetes mellitus    ? GERD (gastroesophageal reflux disease)    ? Heart murmur    ? Hyperlipidemia    ? Hypertension    ? Polyneuropathy in diabetes(357.2)      Past Surgical History:   Procedure Laterality Date   ? CESAREAN SECTION     ? HERNIA REPAIR     ? HYSTERECTOMY       Family History   Problem Relation Age of Onset   ? High Blood Pressure Mother    ? Cancer Mother      breast and colon   ? Breast Cancer Mother 64   ? Depression Father    ? Heart Disease Father    ? High Blood Pressure Father    ? Cancer Father      brain   ? Cancer Sister      breast   ? Breast Cancer Sister 85   ? Cancer Maternal Aunt      throat     Social History     Social History   ? Marital status: Married     Spouse name: N/A   ? Number of children: N/A   ? Years of education: N/A     Occupational History   ? Not on file.     Social History Main Topics   ? Smoking status: Former Smoker   ? Smokeless tobacco: Former Neurosurgeon   ? Alcohol use No   ? Drug use: No   ? Sexual activity: Not on file     Other Topics Concern   ? Not on file     Social History Narrative     Current Outpatient Prescriptions on File Prior to Visit   Medication Sig   ? buPROPion (WELLBUTRIN XL) 150 MG Tablet Extended Release 24 Hour Take 1 tablet by mouth every morning.

## 2017-01-11 NOTE — Progress Notes
Musculoskeletal: Positive for neck pain and neck stiffness. Negative for arthralgias, back pain, joint swelling and myalgias.   Skin: Negative for color change, rash and wound.   Allergic/Immunologic: Negative.    Neurological: Positive for headaches. Negative for dizziness, weakness and numbness.   Hematological: Negative for adenopathy. Does not bruise/bleed easily.   Psychiatric/Behavioral: Negative for sleep disturbance. The patient is not nervous/anxious.            Objective:        VITAL SIGNS (all recorded)      Clinic Vitals       01/11/17 1102             Amb Encounter Vitals    Weight 74.8 kg (165 lb)    -DW at 01/11/17 1104       Height 1.626 m ( )    -DW at 01/11/17 1104       BMI (Calculated) 28.38    -DW at 01/11/17 1104       BSA (Calculated - sq m) 1.84    -DW at 01/11/17 1104       BP 122/84    -DW at 01/11/17 1104       BP Location Left upper arm    -DW at 01/11/17 1104       Position Sitting    -DW at 01/11/17 1104       Pulse 81    -DW at 01/11/17 1104       Pulse Source Radial    -DW at 01/11/17 1104       Pulse Quality Normal    -DW at 01/11/17 1104       Resp 16    -DW at 01/11/17 1104       Respiration Quality Normal    -DW at 01/11/17 1104       Temp 36.9 ?C (98.5 ?F)    -DW at 01/11/17 1104       Temperature Source Oral    -DW at 01/11/17 1104       O2 Saturation 94 %    -DW at 01/11/17 1104       Pain Score Zero    -DW at 01/11/17 1104       Education/Communication Barriers?    Learning/Communication Barriers? No    -DW at 01/11/17 1104       Fall Risk Assessment    Had recent fall / Last 6 months? No recent fall    -DW at 01/11/17 1104       Does patient have a fear of falling? No    -DW at 01/11/17 1104         User Key  (r) = Recorded By, (t) = Taken By, (c) = Cosigned By    Initials Name Effective Dates    DW Heloise Purpura, MA 12/31/15 -         Physical Exam   Constitutional: She is oriented to person, place, and time. She appears

## 2017-01-22 ENCOUNTER — Ambulatory Visit: Attending: Family Medicine | Primary: Family Medicine

## 2017-01-22 DIAGNOSIS — E663 Overweight: Secondary | ICD-10-CM

## 2017-01-22 DIAGNOSIS — Z6828 Body mass index (BMI) 28.0-28.9, adult: Secondary | ICD-10-CM

## 2017-01-22 DIAGNOSIS — E785 Hyperlipidemia, unspecified: Secondary | ICD-10-CM

## 2017-01-22 DIAGNOSIS — M47817 Spondylosis without myelopathy or radiculopathy, lumbosacral region: Secondary | ICD-10-CM

## 2017-01-22 DIAGNOSIS — Z23 Encounter for immunization: Principal | ICD-10-CM

## 2017-01-22 DIAGNOSIS — Z794 Long term (current) use of insulin: Secondary | ICD-10-CM

## 2017-01-22 DIAGNOSIS — M47812 Spondylosis without myelopathy or radiculopathy, cervical region: Secondary | ICD-10-CM

## 2017-01-22 DIAGNOSIS — E119 Type 2 diabetes mellitus without complications: Secondary | ICD-10-CM

## 2017-01-22 NOTE — Patient Instructions
How can I be more active in my day-to-day activities?  ? Use the stairs instead of the elevator.  ? Take a walk during your lunch break.  ? If you drive, park your car farther away from work or school.  ? If you take public transportation, get off one stop early and walk the rest of the way.  ? Make all of your phone calls while standing up and walking around.  ? Get up, stretch, and walk around every 30 minutes throughout the day.  What guidelines should I follow while exercising?  ? Do not exercise so much that you hurt yourself, feel dizzy, or get very short of breath.  ? Consult your health care provider prior to starting a new exercise program.  ? Wear comfortable clothes and shoes with good support.  ? Drink plenty of water while you exercise to prevent dehydration or heat stroke. Body water is lost during exercise and must be replaced.  ? Work out until you breathe faster and your heart beats faster.  This information is not intended to replace advice given to you by your health care provider. Make sure you discuss any questions you have with your health care provider.  Document Released: 10/17/2010 Document Revised: 02/20/2016 Document Reviewed: 02/15/2014  Elsevier Interactive Patient Education ? 2018 [employer]  Diabetes Mellitus and Standards of Medical Care  Managing diabetes (diabetes mellitus) can be complicated. Your diabetes treatment may be managed by a team of health care providers, including:  ? A diet and nutrition specialist (registered dietitian).  ? A nurse.  ? A certified diabetes educator (CDE).  ? A diabetes specialist (endocrinologist).  ? An eye doctor.  ? A primary care provider.  ? A dentist.  Your health care providers follow a schedule in order to help you get the best quality of care. The following schedule is a general guideline for your diabetes management plan. Your health care providers may also give you more specific instructions.  HbA1c (  hemoglobin A1c) test

## 2017-01-22 NOTE — Patient Instructions
Exercising to Lose Weight  Exercising can help you to lose weight. In order to lose weight through exercise, you need to do vigorous-intensity exercise. You can tell that you are exercising with vigorous intensity if you are breathing very hard and fast and cannot hold a conversation while exercising.  Moderate-intensity exercise helps to maintain your current weight. You can tell that you are exercising at a moderate level if you have a higher heart rate and faster breathing, but you are still able to hold a conversation.  How often should I exercise?  Choose an activity that you enjoy and set realistic goals. Your health care provider can help you to make an activity plan that works for you. Exercise regularly as directed by your health care provider. This may include:  ? Doing resistance training twice each week, such as:  ? Push-ups.  ? Sit-ups.  ? Lifting weights.  ? Using resistance bands.  ? Doing a given intensity of exercise for a given amount of time. Choose from these options:  ? 150 minutes of moderate-intensity exercise every week.  ? 75 minutes of vigorous-intensity exercise every week.  ? A mix of moderate-intensity and vigorous-intensity exercise every week.  Children, pregnant women, people who are out of shape, people who are overweight, and older adults may need to consult a health care provider for individual recommendations. If you have any sort of medical condition, be sure to consult your health care provider before starting a new exercise program.  What are some activities that can help me to lose weight?  ? Walking at a rate of at least 4.5 miles an hour.  ? Jogging or running at a rate of 5 miles per hour.  ? Biking at a rate of at least 10 miles per hour.  ? Lap swimming.  ? Roller-skating or in-line skating.  ? Cross-country skiing.  ? Vigorous competitive sports, such as football, basketball, and soccer.  ? Jumping rope.  ? Aerobic dancing.

## 2017-01-22 NOTE — Patient Instructions
?   Education about how to manage your diabetes is recommended at diagnosis and ongoing as needed.  Treatment plan  ? Your treatment plan will be reviewed at every medical visit.  Summary  ? Managing diabetes (diabetes mellitus) can be complicated. Your diabetes treatment may be managed by a team of health care providers.  ? Your health care providers follow a schedule in order to help you get the best quality of care.  ? Standards of care including having regular physical exams, blood tests, blood pressure monitoring, immunizations, screening tests, and education about how to manage your diabetes.  ? Your health care providers may also give you more specific instructions based on your individual health.  This information is not intended to replace advice given to you by your health care provider. Make sure you discuss any questions you have with your health care provider.  Document Released: 07/12/2009 Document Revised: 06/12/2016 Document Reviewed: 06/12/2016  Elsevier Interactive Patient Education ? 2018 [employer]  Diabetes Report Card  VITALS  Weight  Blood Pressure  BMI GOAL  Less than or equal to 130/80 Date  01/22/2017 01/11/2017 12/07/2016 11/23/2016 09/23/2016 07/24/2016   WT: 168 lb 165 lb 165 lb 164 lb 165 lb (No Data)   BP: 146/95 122/84 145/95 146/82 146/91 116/81   BMI: 28.9 28.38 28.38 28.21 28.38 -      TESTS  HbA1c      (Sugar for 3 months)     LDL     (Lousy or Bad Cholesterol)      HDL     (Happy or Good Cholesterol)    Triglycerides        (Another bad fatty substance)   Less than 7  Best if 6    Less than 100   Best 70      Greater than 50      Less than 150          11/23/16   1044 09/23/16   HGBA1C  6.0*  6.8              11/23/16   1044   LDL  204*              11/23/16   1044   HDL  49*              11/23/16   1044   TRIG  123            * Abnormal result     MEDICATION  Aspirin or Anti-coagulant        (to prevent heart attacks)   Take Daily

## 2017-01-22 NOTE — Patient Instructions
This test provides information about blood sugar (glucose) control over the previous 2?3 months. It is used to check whether your diabetes management plan needs to be adjusted.  ? If you are meeting your treatment goals, this test is done at least 2 times a year.  ? If you are not meeting treatment goals or if your treatment goals have changed, this test is done 4 times a year.  Blood pressure test  ? This test is done at every routine medical visit. For most people, the goal is less than 130/80. Ask your health care provider what your goal blood pressure should be.  Dental and eye exams  ? Visit your dentist two times a year.  ? If you have type 1 diabetes, get an eye exam 3?5 years after you are diagnosed, and then once a year after your first exam.  ? If you were diagnosed with type 1 diabetes as a child, get an eye exam when you are age 10 or older and have had diabetes for 3?5 years. After the first exam, you should get an eye exam once a year.  ? If you have type 2 diabetes, have an eye exam as soon as you are diagnosed, and then once a year after your first exam.  Foot care exam  ? Visual foot exams are done at every routine medical visit. The exams check for cuts, bruises, redness, blisters, sores, or other problems with the feet.  ? A complete foot exam is done by your health care provider once a year. This exam includes an inspection of the structure and skin of your feet, and a check of the pulses and sensation in your feet.  ? Type 1 diabetes: Get your first exam 3?5 years after diagnosis.  ? Type 2 diabetes: Get your first exam as soon as you are diagnosed.  ? Check your feet every day for cuts, bruises, redness, blisters, or sores. If you have any of these or other problems that are not healing, contact your health care provider.  Kidney function test (  urine microalbumin)  ? This test is done once a year.  ? Type 1 diabetes: Get your first test 5 years after diagnosis.

## 2017-01-22 NOTE — Patient Instructions
?   Type 2 diabetes: Get your first test as soon as you are diagnosed.  ? If you have chronic kidney disease (CKD), get a serum creatinine and estimated glomerular filtration rate (eGFR) test once a year.  Lipid profile (cholesterol, HDL, LDL, triglycerides)  ? This test should be done when you are diagnosed with diabetes, and every 5 years after the first test. If you are on medicines to lower your cholesterol, you may need to get this test done every year.  ? The goal for LDL is less than 100 mg/dL (5.5 mmol/L). If you are at high risk, the goal is less than 70 mg/dL (3.9 mmol/L).  ? The goal for HDL is 40 mg/dL (2.2 mmol/L) for men and 50 mg/dL(2.8 mmol/L) for women. An HDL cholesterol of 60 mg/dL (3.3 mmol/L) or higher gives some protection against heart disease.  ? The goal for triglycerides is less than 150 mg/dL (8.3 mmol/L).  Immunizations  ? The yearly flu (influenza) vaccine is recommended for everyone 6 months or older who has diabetes.  ? The pneumonia (pneumococcal) vaccine is recommended for everyone 2 years or older who has diabetes. If you are 50 or older, you may get the pneumonia vaccine as a series of two separate shots.  ? The hepatitis B vaccine is recommended for adults shortly after they have been diagnosed with diabetes.  ? The Tdap (tetanus, diphtheria, and pertussis) vaccine should be given:  ? According to normal childhood vaccination schedules, for children.  ? Every 10 years, for adults who have diabetes.  ? The shingles vaccine is recommended for people who have had chicken pox and are 50 years or older.  Mental and emotional health  ? Screening for symptoms of eating disorders, anxiety, and depression is recommended at the time of diagnosis and afterward as needed. If your screening shows that you have symptoms (you have a positive screening result), you may need further evaluation and be referred to a mental health care provider.  Diabetes self-management education

## 2017-01-22 NOTE — Patient Instructions
Important Diabetic Yearly Activities (Health Maintenance)  ? Eye Check (to prevent blindness)  ? Foot Check (to check for numbness and sores)  ? Urine Micro Albumin (to check for kidney failure)  ? Flu Shot (to prevent flu)  ? Pneumovax (to prevent a special pneumonia-given once in a lifetime-twice if first given before age 59)    The following are your upcoming &/or overdue Health Maintenance which include the Diabetic yearly activities.  Health Maintenance Reminders       Due Completion Dates    Pneumovax / Prevnar (1 of 1 - PPSV23) 01/18/2017 11/23/2016    Breast Cancer Screening 03/12/2017 (Originally 10/19/2016) 10/19/2014, 09/10/2014, 12/30/2010    Chest CT / LDCT 03/12/2017 (Originally 10/25/2012) ---    Diabetic Foot Exam 05/18/2017 05/18/2016 (Done), 02/22/2015 (Done)    Diabetic Eye Exam 05/18/2017 05/18/2016 (Declined), 02/22/2015 (Done)    Hemoglobin A1C 05/23/2017 11/23/2016, 09/23/2016, 07/24/2016 (Declined)    Zoster Vaccine (1) 10/25/2017 ---    Preventive Wellness Visit 11/23/2017 11/23/2016, 11/01/2015    Lipid Profile 11/23/2017 11/23/2016, 07/24/2016 (Declined), 05/28/2015    Urine Microalbumin 11/23/2017 11/23/2016, 07/24/2016 (Declined), 05/28/2015    Creatinine 11/23/2017 11/23/2016, 04/27/2016, 05/28/2015    Basic Metabolic Panel 11/23/2017 11/23/2016, 04/27/2016, 05/28/2015    Colon Cancer Screening 05/04/2022 05/04/2012 (Done)

## 2017-01-25 NOTE — Progress Notes
?   Asthma    ? Diabetes mellitus    ? GERD (gastroesophageal reflux disease)    ? Heart murmur    ? Hyperlipidemia    ? Hypertension    ? Polyneuropathy in diabetes(357.2)      Past Surgical History:   Procedure Laterality Date   ? CESAREAN SECTION     ? HERNIA REPAIR     ? HYSTERECTOMY       Family History   Problem Relation Age of Onset   ? High Blood Pressure Mother    ? Cancer Mother      breast and colon   ? Breast Cancer Mother 85   ? Depression Father    ? Heart Disease Father    ? High Blood Pressure Father    ? Cancer Father      brain   ? Cancer Sister      breast   ? Breast Cancer Sister 90   ? Cancer Maternal Aunt      throat     Social History   Substance Use Topics   ? Smoking status: Former Smoker   ? Smokeless tobacco: Former Neurosurgeon   ? Alcohol use No     Current Outpatient Prescriptions on File Prior to Visit   Medication Sig   ? buPROPion (WELLBUTRIN XL) 150 MG Tablet Extended Release 24 Hour Take 1 tablet by mouth every morning.   ? ezetimibe (ZETIA) 10 MG PO Tablet TAKE 1 TABLET BY MOUTH EVERY DAY   ? hydroCHLOROthiazide (HYDRODIURIL) 25 MG PO Tablet Take 1 tablet by mouth daily.   ? ibuprofen (ADVIL,MOTRIN) 600 MG Tablet Take 1 Tablet by mouth every 6 hours as needed for pain.   ? insulin aspart (NovoLOG) pen Inject 15 Units into the skin 3 times daily (before meals).   ? LEVEMIR FlexTouch INJECT 30 UNITS INTO THE SKIN NIGHTLY AT BEDTIME.   ? lisinopril (PRINIVIL,ZESTRIL) 5 MG Tablet Take 1 tablet by mouth daily.   ? NOVOFINE 32G X 6 MM Miscellaneous USE 1 AT BEDTIME AND 3 DAILY BEFORE MEALS   ? omeprazole (PriLOSEC) 40 MG Capsule Delayed Release Take 1 capsule by mouth daily.   ? ondansetron (ZOFRAN-ODT) 4 MG Tablet Disintegrating Dissolve 1 tablet in mouth every 6 hours as needed.   ? polyethylene glycol (GLYCOLAX) Powder MIX 2 CAPFUL (34 GM) IN 8 - 16 OUNCES OF WATER, JUICE OR TEA AND DRINK DAILY.   ? tiZANidine (ZANAFLEX) 4 MG PO Tablet Take 1 tablet by mouth nightly at bedtime as needed.

## 2017-01-25 NOTE — Progress Notes
TBILI 0.4 11/23/2016    ALKPHOS 83 11/23/2016   , PSA: No results found for: PSAFREE, PSAFTRATIO, PSA, Lipid Profile    Lab Results   Component Value Date/Time    CHOL 280 (H) 11/23/2016 10:44 AM    TRIG 123 11/23/2016 10:44 AM    HDL 49 (L) 11/23/2016 10:44 AM    LDL 204 (H) 11/23/2016 10:44 AM    LDL 218 (H) 05/15/2013 11:28 AM   , Thyroid Studies    Lab Results   Component Value Date    TSH 1.00 11/23/2016    FREET4 1.1 11/23/2016    and HGBA1C:    Lab Results   Component Value Date/Time    HGBA1C 6.0 (H) 11/23/2016 10:44 AM         Assessment/ Plan   DJD (degenerative joint disease), cervical  DJD (degenerative joint disease), lumbosacral  I advised the patient to use the hot tub therapy to further manage her DJD cervical and lumbar pain. Continue additional conservative measures.    Type 2 diabetes mellitus without complication, with long-term current use of insulin  Patient's glucose control is well controlled on the current regimen. Recent HbA1c was 6.0 . The patient has had issues with compliance (ie. Diet, exercise, medication adherence) and this was discussed in depth during this visit. Will continue Novolog 15units and adjust to Levemir 12 units medications to maintain adequate glucose control. Labs including a urine microalbumin will be obtained every 6-12 months. Diabetic foot check has been completed.  Referrals for the annual eye exam and diabetes education has been completed.     Hyperlipidemia, unspecified hyperlipidemia type  Will continue to check lipid panel q6-12 months. Patient encouraged to exercise and reduce CV risk factors. Medication adherence was stressed.  I advised the patient to stop utilizing Zetia  to further prevent muscle spasms.    Body mass index 28.0-28.9, adult, Overweight  Maintaining a healthy weight is essential. Behavior modification, exercise (150 minutes of moderate intensity exercise per week), and dietary changes

## 2017-01-25 NOTE — Progress Notes
are encouraged. Will consider pharmacologic therapy for patients with a BMI greater than 30 kg/m2 that have failed to lose weight with diet and exercise alone.    Reviewed pertinent labs/diagnostics/notes with patient.     Discussed plan of care with patient. Patient is also advised to various treatment options and agreed to the following: taking medication as directed, keeping follow up visits.     Risks verses benefits of medication and treatment were discussed. Patient voiced agreement and understanding.    F/u as scheduled or sooner as needed. Patient advised to be vigilant and to report any worsening symptoms. If concerning symptoms emerge or worsen, please seek treatment at ED.     Goals     ? Exercise 150 minutes per week (moderate activity)     ? HEMOGLOBIN A1C < 7.0     ? Reduce salt intake to 2 grams per day or less           Health Maintenance   Topic Date Due   ? Pneumovax / Prevnar (1 of 1 - PPSV23) 01/18/2017   ? Breast Cancer Screening  03/12/2017 (Originally 10/19/2016)   ? Chest CT / LDCT  03/12/2017 (Originally 10/25/2012)   ? Diabetic Foot Exam  05/18/2017   ? Diabetic Eye Exam  05/18/2017   ? Hemoglobin A1C  05/23/2017   ? Zoster Vaccine (1) 10/25/2017   ? Preventive Wellness Visit  11/23/2017   ? Lipid Profile  11/23/2017   ? Urine Microalbumin  11/23/2017   ? Creatinine  11/23/2017   ? Basic Metabolic Panel  11/23/2017   ? Colon Cancer Screening  05/04/2022   ? USPSTF HIV Risk Assessment  Completed   ? USPSTF Hepatitis C Screening  Completed   ? Influenza Vaccine  Addressed   ? DTaP,Tdap,and Td Vaccines  Excluded            Scribe Attestation: I, Donneta Romberg, have acted as a Neurosurgeon for Dr. Loraine Grip 1:22 PM 01/22/2017     Physician Attestation: I have reviewed and confirmed the information stated by the scribe and made corrections and edits as appropriate. Despite these efforts, minor errors may be noted. I have personally provided the services documented by the scribe.   ?

## 2017-01-25 NOTE — Progress Notes
?   varenicline (CHANTIX STARTING MONTH PAK) 0.5 MG X 11 & 1 MG X 42 tablet Take one 0.5mg  tab by mouth once daily x3 days; then increase     No current facility-administered medications on file prior to visit.      Allergies   Allergen Reactions   ? No Known Drug Allergy      No Known Drug Allergies         Review of Systems   Review of Systems   Constitutional: Negative for chills and fever.   Respiratory: Negative for cough and shortness of breath.    Cardiovascular: Negative for chest pain, palpitations and leg swelling.   Gastrointestinal: Negative for anal bleeding, constipation, diarrhea, nausea and vomiting.   Musculoskeletal: Positive for arthralgias, back pain and neck pain.   Skin: Negative for rash.   Neurological: Negative for syncope and weakness.      The ROS listed above has been reviewed and addressed.  Physical Exam        VITAL SIGNS (all recorded)      Clinic Vitals       01/22/17 1017             Amb Encounter Vitals    Weight 76.2 kg (168 lb)    -CJ at 01/22/17 1018       Height 1.626 m ( )    -CJ at 01/22/17 1018       BMI (Calculated) 28.9    -CJ at 01/22/17 1018       BSA (Calculated - sq m) 1.85    -CJ at 01/22/17 1018       BP (!)  146/95    -CJ at 01/22/17 1019       BP Location Left upper arm    -CJ at 01/22/17 1018       Position Sitting    -CJ at 01/22/17 1018       Pulse 80    -CJ at 01/22/17 1019       Pulse Source Monitor    -CJ at 01/22/17 1018       Pulse Quality Normal    -CJ at 01/22/17 1018       Resp 18    -CJ at 01/22/17 1018       Respiration Quality Normal    -CJ at 01/22/17 1018       Temp 37.3 ?C (99.1 ?F)    -CJ at 01/22/17 1019       Temperature Source Oral    -CJ at 01/22/17 1018       O2 Saturation 95 %    -CJ at 01/22/17 1019       FiO2 Source RA    -CJ at 01/22/17 1018       Pain Score Zero    -CJ at 01/22/17 1018       Education/Communication Barriers?    Learning/Communication Barriers? No    -CJ at 01/22/17 1018       Fall Risk Assessment

## 2017-01-25 NOTE — Progress Notes
Had recent fall / Last 6 months? No recent fall    -CJ at 01/22/17 1018       Does patient have a fear of falling? No    -CJ at 01/22/17 1018         User Key  (r) = Recorded By, (t) = Taken By, (c) = Cosigned By    [place] Name Effective Dates    Darien Ramus, [place], [place] 12/31/15 -           Physical Exam  Constitution: Well developed, overweight, appears stated age  HEENT:Lids without erythema or lesion, conjunctivae pink, sclera white. Hears soft spoken voice without difficulty  NECK: Supple without stridor   RESPIRATORY: Non labored breathing, symmetric chest wall movement  CV: RRR; no m/g/r, no JVD; no pedal edema   ABD: soft, nondistended, nontender, normoactive bowel sounds  MUSCULOSKELETAL: Arthralgic gait, good muscle tone and strength; (+) tenderness, limitation of motion of cervical spine and lumbar spine; (-) Spurlings and straight leg.  SKIN: warm and well perfused.   NEUROLOGIC: CN: II-XII grossly intact.  PSYCH: oriented to time, person, place; normal speech and content; appropriate mood and affect; interactive and responsive; memory, judgment, and insight are intact      CBC (with or without Differential):   Lab Results   Component Value Date    WBC 7.7 11/23/2016    WBC 10.00 04/27/2016    HGB 14.3 11/23/2016    HCT 42.7 11/23/2016    MCV 85.9 11/23/2016    MCH 28.8 11/23/2016    MCHC 33.5 11/23/2016    RDW 14.3 11/23/2016    PLATCOUNT 271 11/23/2016    PLATCOUNT 252 04/27/2016    MPV 11.8 11/23/2016    NEUTROPCT 46.1 11/23/2016    LYMPHPCT 45.6 11/23/2016    MONOPCT 6.1 11/23/2016    EOSPCT 1.7 11/23/2016    BASOPCT 0.5 11/23/2016   , BMP/CMP:   Lab Results   Component Value Date    NA 145 11/23/2016    K 4.4 11/23/2016    CL 103 11/23/2016    CO2 26 11/23/2016    BUN 19 11/23/2016    CREATININE 0.78 11/23/2016    GLU 61 (L) 11/23/2016    GLU Negative 10/15/2011    CALCIUM 9.9 11/23/2016    TPROT 7.2 04/27/2016    ALB 4.3 11/23/2016    AST 11 11/23/2016    ALT 11 11/23/2016    EGFR 83 11/23/2016

## 2017-01-25 NOTE — Progress Notes
ALKPHOS 83 11/23/2016   , PSA: No results found for: PSAFREE, PSAFTRATIO, PSA, Lipid Profile    Lab Results   Component Value Date/Time    CHOL 280 (H) 11/23/2016 10:44 AM    TRIG 123 11/23/2016 10:44 AM    HDL 49 (L) 11/23/2016 10:44 AM    LDL 204 (H) 11/23/2016 10:44 AM    LDL 218 (H) 05/15/2013 11:28 AM   , Thyroid Studies    Lab Results   Component Value Date    TSH 1.00 11/23/2016    FREET4 1.1 11/23/2016    and HGBA1C:    Lab Results   Component Value Date/Time    HGBA1C 6.0 (H) 11/23/2016 10:44 AM         Assessment/ Plan   DJD (degenerative joint disease), cervical  DJD (degenerative joint disease), lumbosacral  I advised the patient to use the hot tub therapy to further manage her DJD cervical and lumbar pain.    Type 2 diabetes mellitus without complication, with long-term current use of insulin  Patient's glucose control is well controlled on the current regimen. Recent HbA1c was 6.0 . The patient has had issues with compliance (ie. Diet, exercise, medication adherence) and this was discussed in depth during this visit. Will continue Novolog 15units and adjust to Levemir 12 units medications to maintain adequate glucose control. Labs including a urine microalbumin will be obtained every 6-12 months. Diabetic foot check has been completed.  Referrals for the annual eye exam and diabetes education has been completed.     Hyperlipidemia, unspecified hyperlipidemia type  Will continue to check lipid panel q6-12 months. Patient encouraged to exercise and reduce CV risk factors. Medication adherence was stressed.  I advised the patient to stop utilizing Zetia  to further prevent muscle spasms.    Body mass index 28.0-28.9, adult, Overweight  Maintaining a healthy weight is essential. Behavior modification, exercise (150 minutes of moderate intensity exercise per week), and dietary changes are encouraged. Will consider pharmacologic therapy for patients with a

## 2017-01-25 NOTE — Progress Notes
Jeremy L. Coleman, MD  ?

## 2017-01-25 NOTE — Progress Notes
HPI       Delayza Verne is a 61 y.o. female with a history of IDDM, lumbar spine OA, GERD, HPLD, HTN, and tobacco use disorder, here for Neck Pain (pt wants a rx for hydrothrapy for neck pain and back pain)    Ms. Manship presents in the clinic today for her current chronic Cervical and Lumbar pain secondary to her DJD condition. She notes to utilizing physical therapy and not utilizing pillows to further manage her symptoms. Ms. Monk inquires about further management of her condition with a hot tub for therapy to help manage her current cervical and lumbar DJD pain.    Clemmie Saltsman has a h/o diabetes. Symptoms have gradually improved. Patient denies polydipsia, polyuria, diarrhea and visual disturbances. The patient is compliant with a low carb diet, frequent exercise, medications. Recent evaluation includes: fasting blood sugar, fasting lipid panel and hemoglobin A1C.  Home sugars: BGs range between 90 and 110. The patient has seen podiatry within the last 12 months months. A visit with an eye doctor has been completed. Patient notes to only be utilizing Novolog 10 units in the morning and not being compliant with Levemir insulin regimen.  She also notes to recently changed her dietary habits and this has well managed her condition.    She presents for follow up of hyperlipidemia.  Compliance with treatment has been poor.  The patient exercises intermittently. Patient complains of muscle pain associated with their medications. Ms. Salsberry notes to be compliant with Zetia  but has experienced associated severe bilateral leg spasms. She notes to be utilizing heating pads to further manage her associated symptoms.       Pain level and fall risk were assessed. Health issues since the last visit were reviewed.    Past Medical History:   Diagnosis Date   ? Acute pancreatitis, unspecified complication status, unspecified pancreatitis type 04/30/2016   ? Allergy to environmental factors    ? Arthritis

## 2017-01-25 NOTE — Progress Notes
Had recent fall / Last 6 months? No recent fall    -CJ at 01/22/17 1018       Does patient have a fear of falling? No    -CJ at 01/22/17 1018         User Key  (r) = Recorded By, (t) = Taken By, (c) = Cosigned By    [place] Name Effective Dates    Darien Ramus, [place], [place] 12/31/15 -           Physical Exam  Constitution: Well developed, overweight, appears stated age  HEENT:Lids without erythema or lesion, conjunctivae pink, sclera white. Hears soft spoken voice without difficulty  NECK: Supple without stridor   RESPIRATORY: Non labored breathing, symmetric chest wall movement  CV: RRR; no m/g/r, no JVD; no pedal edema   ABD: soft, nondistended, nontender, normoactive bowel sounds  MUSCULOSKELETAL: Arthralgic gait, good muscle tone and strength; no swelling, tenderness, limitation of motion of any joint.   SKIN: warm and well perfused.   NEUROLOGIC: CN: II-XII grossly intact.  PSYCH: oriented to time, person, place; normal speech and content; appropriate mood and affect; interactive and responsive; memory, judgment, and insight are intact      CBC (with or without Differential):   Lab Results   Component Value Date    WBC 7.7 11/23/2016    WBC 10.00 04/27/2016    HGB 14.3 11/23/2016    HCT 42.7 11/23/2016    MCV 85.9 11/23/2016    MCH 28.8 11/23/2016    MCHC 33.5 11/23/2016    RDW 14.3 11/23/2016    PLATCOUNT 271 11/23/2016    PLATCOUNT 252 04/27/2016    MPV 11.8 11/23/2016    NEUTROPCT 46.1 11/23/2016    LYMPHPCT 45.6 11/23/2016    MONOPCT 6.1 11/23/2016    EOSPCT 1.7 11/23/2016    BASOPCT 0.5 11/23/2016   , BMP/CMP:   Lab Results   Component Value Date    NA 145 11/23/2016    K 4.4 11/23/2016    CL 103 11/23/2016    CO2 26 11/23/2016    BUN 19 11/23/2016    CREATININE 0.78 11/23/2016    GLU 61 (L) 11/23/2016    GLU Negative 10/15/2011    CALCIUM 9.9 11/23/2016    TPROT 7.2 04/27/2016    ALB 4.3 11/23/2016    AST 11 11/23/2016    ALT 11 11/23/2016    EGFR 83 11/23/2016    TBILI 0.4 11/23/2016

## 2017-01-25 NOTE — Progress Notes
BMI greater than 30 kg/m2 that have failed to lose weight with diet and exercise alone.    Reviewed pertinent labs/diagnostics/notes with patient.     Discussed plan of care with patient. Patient is also advised to various treatment options and agreed to the following: taking medication as directed, keeping follow up visits.     Risks verses benefits of medication and treatment were discussed. Patient voiced agreement and understanding.    F/u as scheduled or sooner as needed. Patient advised to be vigilant and to report any worsening symptoms. If concerning symptoms emerge or worsen, please seek treatment at ED.     Goals     ? Exercise 150 minutes per week (moderate activity)     ? HEMOGLOBIN A1C < 7.0     ? Reduce salt intake to 2 grams per day or less           Health Maintenance   Topic Date Due   ? Pneumovax / Prevnar (1 of 1 - PPSV23) 01/18/2017   ? Breast Cancer Screening  03/12/2017 (Originally 10/19/2016)   ? Chest CT / LDCT  03/12/2017 (Originally 10/25/2012)   ? Diabetic Foot Exam  05/18/2017   ? Diabetic Eye Exam  05/18/2017   ? Hemoglobin A1C  05/23/2017   ? Zoster Vaccine (1) 10/25/2017   ? Preventive Wellness Visit  11/23/2017   ? Lipid Profile  11/23/2017   ? Urine Microalbumin  11/23/2017   ? Creatinine  11/23/2017   ? Basic Metabolic Panel  11/23/2017   ? Colon Cancer Screening  05/04/2022   ? USPSTF HIV Risk Assessment  Completed   ? USPSTF Hepatitis C Screening  Completed   ? Influenza Vaccine  Addressed   ? DTaP,Tdap,and Td Vaccines  Excluded            Scribe Attestation: I, Donneta Romberg, have acted as a Neurosurgeon for Dr. Loraine Grip 1:22 PM 01/22/2017     Physician Attestation: Marland Kitchen

## 2017-04-17 DIAGNOSIS — E782 Mixed hyperlipidemia: Principal | ICD-10-CM

## 2017-04-22 MED ORDER — ATORVASTATIN CALCIUM 20 MG PO TABS
0 refills | Status: CP
Start: 2017-04-22 — End: 2017-06-01

## 2017-06-01 ENCOUNTER — Ambulatory Visit: Attending: Family Medicine | Primary: Family Medicine

## 2017-06-01 DIAGNOSIS — Z6828 Body mass index (BMI) 28.0-28.9, adult: Principal | ICD-10-CM

## 2017-06-01 DIAGNOSIS — K219 Gastro-esophageal reflux disease without esophagitis: Secondary | ICD-10-CM

## 2017-06-01 DIAGNOSIS — Z794 Long term (current) use of insulin: Secondary | ICD-10-CM

## 2017-06-01 DIAGNOSIS — K859 Acute pancreatitis without necrosis or infection, unspecified: Secondary | ICD-10-CM

## 2017-06-01 DIAGNOSIS — E782 Mixed hyperlipidemia: Secondary | ICD-10-CM

## 2017-06-01 DIAGNOSIS — J45909 Unspecified asthma, uncomplicated: Secondary | ICD-10-CM

## 2017-06-01 DIAGNOSIS — E119 Type 2 diabetes mellitus without complications: Secondary | ICD-10-CM

## 2017-06-01 DIAGNOSIS — E663 Overweight: Secondary | ICD-10-CM

## 2017-06-01 DIAGNOSIS — R011 Cardiac murmur, unspecified: Secondary | ICD-10-CM

## 2017-06-01 DIAGNOSIS — E785 Hyperlipidemia, unspecified: Secondary | ICD-10-CM

## 2017-06-01 DIAGNOSIS — Z9109 Other allergy status, other than to drugs and biological substances: Secondary | ICD-10-CM

## 2017-06-01 DIAGNOSIS — R300 Dysuria: Secondary | ICD-10-CM

## 2017-06-01 DIAGNOSIS — I10 Essential (primary) hypertension: Secondary | ICD-10-CM

## 2017-06-01 DIAGNOSIS — E1142 Type 2 diabetes mellitus with diabetic polyneuropathy: Secondary | ICD-10-CM

## 2017-06-01 DIAGNOSIS — M1990 Unspecified osteoarthritis, unspecified site: Secondary | ICD-10-CM

## 2017-06-01 MED ORDER — ATORVASTATIN CALCIUM 20 MG PO TABS
20 mg | Freq: Every day | ORAL | 1 refills | Status: CP
Start: 2017-06-01 — End: 2018-03-01

## 2017-06-01 MED ORDER — METFORMIN HCL 500 MG PO TABS
500 mg | Freq: Every day | ORAL | 1 refills | Status: CP
Start: 2017-06-01 — End: ?

## 2017-06-02 NOTE — Progress Notes
Topic Date Due   ? Zoster Vaccine (1 of 2) 10/26/2007   ? Chest CT / LDCT  10/25/2012   ? Breast Cancer Screening  10/19/2016   ? Pneumovax / Prevnar (1 of 1 - PPSV23) 01/18/2017   ? Diabetic Foot Exam  05/18/2017   ? Diabetic Eye Exam  05/18/2017   ? Hemoglobin A1C  05/23/2017   ? Influenza Vaccine (1) 05/29/2017   ? Colon Cancer Screening  10/31/2017   ? Preventive Wellness Visit  11/23/2017   ? Lipid Profile  11/23/2017   ? Urine Microalbumin  11/23/2017   ? Creatinine  11/23/2017   ? Basic Metabolic Panel  11/23/2017   ? USPSTF HIV Risk Assessment  Completed   ? USPSTF Hepatitis C Screening  Completed   ? DTaP,Tdap,and Td Vaccines  Excluded     Scribe Attestation: IThom Chimes, Kamahl Lewis, have acted as a Neurosurgeonscribe for Robb MatarJeremy Latron Coleman, MD 11:43 AM 06/01/2017     Physician Attestation: Marland Kitchen***

## 2017-06-02 NOTE — Progress Notes
-  CJ at 06/01/17 1140       O2 Saturation 99 %    -CJ at 06/01/17 1140       FiO2 Source RA    -CJ at 06/01/17 1140       Pain Score Zero    -CJ at 06/01/17 1140          Education/Communication Barriers?    Learning/Communication Barriers? No    -CJ at 06/01/17 1140          Fall Risk Assessment    Had recent fall / Last 6 months? No recent fall    -CJ at 06/01/17 1140       Does patient have a fear of falling? No    -CJ at 06/01/17 1140         User Key  (r) = Recorded By, (t) = Taken By, (c) = Cosigned By    [place] Name Effective Dates    Colin InaJ Jackson, [place], [place] 12/31/15 -         Physical Exam  Constitution: Well developed, Overweight, appears stated age  HEENT:Lids without erythema or lesion, conjunctivae pink, sclera white. Hears soft spoken voice without difficulty  NECK: Supple with no stridor   RESPIRATORY: Non labored breathing, symmetric chest wall movement  CV: RRR; no m/g/r, no JVD; no pedal edema   ABD: soft, nondistended, nontender, normoactive bowel sounds  MUSCULOSKELETAL: Normal gait, good muscle tone and strength; no swelling, tenderness, limitation of motion of any joint.   SKIN: warm and well perfused.   NEUROLOGIC: CN: II-XII grossly intact.  PSYCH: oriented to time, person, place; normal speech and content; appropriate mood and affect; interactive and responsive; memory, judgment, and insight are intact.    CBC (with or without Differential):   Lab Results   Component Value Date    WBC 7.7 11/23/2016    HGB 14.3 11/23/2016    HCT 42.7 11/23/2016    MCV 85.9 11/23/2016    MCH 28.8 11/23/2016    MCHC 33.5 11/23/2016    RDW 14.3 11/23/2016    PLATCOUNT 271 11/23/2016    MPV 11.8 11/23/2016    NEUTROPCT 46.1 11/23/2016    LYMPHPCT 45.6 11/23/2016    MONOPCT 6.1 11/23/2016    EOSPCT 1.7 11/23/2016    BASOPCT 0.5 11/23/2016   , BMP/CMP:   Lab Results   Component Value Date    NA 145 11/23/2016    K 4.4 11/23/2016    CL 103 11/23/2016    CO2 26 11/23/2016    BUN 19 11/23/2016

## 2017-06-02 NOTE — Progress Notes
?   Diabetic Eye Exam  05/18/2017   ? Hemoglobin A1C  05/23/2017   ? Influenza Vaccine (1) 05/29/2017   ? Colon Cancer Screening  10/31/2017   ? Preventive Wellness Visit  11/23/2017   ? Lipid Profile  11/23/2017   ? Urine Microalbumin  11/23/2017   ? Creatinine  11/23/2017   ? Basic Metabolic Panel  11/23/2017   ? USPSTF HIV Risk Assessment  Completed   ? USPSTF Hepatitis C Screening  Completed   ? DTaP,Tdap,and Td Vaccines  Excluded     Scribe Attestation: I, Thom ChimesKamahl Lewis, have acted as a Neurosurgeonscribe for Robb MatarJeremy Latron Coleman, MD 11:43 AM 06/01/2017     Physician Attestation: I have reviewed and confirmed the information stated by the scribe and made corrections and edits as appropriate. Despite these efforts, minor errors may be noted. I have personally provided the services documented by the scribe.   ?  Riki RuskJeremy L. Effie Shyoleman, MD  ?

## 2017-06-02 NOTE — Progress Notes
Essential hypertension    Antihypertensive medications are needed if systolic BP is persistently ?140 mmHg (in patients <60 years) or ?150 mmHg (in patients > 60 years) and/or the diastolic pressure is persistently ?90 mmHg despite attempted nonpharmacologic therapy. Today, the patient's blood pressure is at goal. Will continue to encourage lifestyle changes.    Continue HZTZ 25mg .    Dysuria  -     POCT Urinalysis w/out microscopy inhouse-AUTO    I have ordered a urinalysis test.     Hyperlipidemia, unspecified hyperlipidemia type; Mixed hyperlipidemia  -     POCT cholesterol        -     atorvastatin (LIPITOR) 20 MG PO Tablet; Take 1 tablet by mouth daily.  Dispense: 90 tablet; Refill: 1    Will continue current medication regimen and check lipid panel q6-12 months. Patient encouraged to exercise and reduce CV risk factors. Medication adherence was stressed.    I have advised the patient to begin her Lipitor 20mg  regimen again.    I ordered a CBC, Chem12, Lipid Profile, and TSH.     Reviewed pertinent labs/diagnostics/notes with patient.     Discussed plan of care with patient. Patient is also advised to various treatment options and agreed to the following: taking medication as directed, keeping follow up visits.     Risks verses benefits of medication and treatment were discussed. Patient voiced agreement and understanding.    F/u as scheduled or sooner as needed. Patient advised to be vigilant and to report any worsening symptoms. If concerning symptoms emerge or worsen, please seek treatment at ED.     Goals     ? Exercise 150 minutes per week (moderate activity)     ? HEMOGLOBIN A1C < 7.0     ? Reduce salt intake to 2 grams per day or less           Health Maintenance   Topic Date Due   ? Zoster Vaccine (1 of 2) 10/26/2007   ? Chest CT / LDCT  10/25/2012   ? Breast Cancer Screening  10/19/2016   ? Pneumovax / Prevnar (1 of 1 - PPSV23) 01/18/2017   ? Diabetic Foot Exam  05/18/2017

## 2017-06-02 NOTE — Progress Notes
?   ondansetron (ZOFRAN-ODT) 4 MG Tablet Disintegrating Dissolve 1 tablet in mouth every 6 hours as needed.   ? polyethylene glycol (GLYCOLAX) Powder MIX 2 CAPFUL (34 GM) IN 8 - 16 OUNCES OF WATER, JUICE OR TEA AND DRINK DAILY.   ? tiZANidine (ZANAFLEX) 4 MG PO Tablet Take 1 tablet by mouth nightly at bedtime as needed.   ? varenicline (CHANTIX STARTING MONTH PAK) 0.5 MG X 11 & 1 MG X 42 tablet Take one 0.5mg  tab by mouth once daily x3 days; then increase     No current facility-administered medications on file prior to visit.      Allergies   Allergen Reactions   ? No Known Drug Allergy      No Known Drug Allergies       Review of Systems   Review of Systems   Constitutional: Negative for chills and fever.   Respiratory: Negative for shortness of breath.    Cardiovascular: Negative for chest pain, palpitations and leg swelling.   Gastrointestinal: Negative for diarrhea, nausea and vomiting.   Genitourinary: Negative for dysuria.   Musculoskeletal: Positive for joint swelling.   Skin: Negative for rash.   Neurological: Negative for syncope, weakness and numbness.      The ROS listed above has been reviewed and addressed.  Physical Exam        VITAL SIGNS (all recorded)      Clinic Vitals     Row Name 06/01/17 1140                Amb Encounter Vitals    Weight 71.7 kg (158 lb)    -CJ at 06/01/17 1140       Height 1.626 m (5\' 4" )    -CJ at 06/01/17 1140       BMI (Calculated) 27.18    -CJ at 06/01/17 1140       BSA (Calculated - sq m) 1.8    -CJ at 06/01/17 1140       BP 137/87    -CJ at 06/01/17 1140       BP Location Right upper arm    -CJ at 06/01/17 1140       Position Sitting    -CJ at 06/01/17 1140       Pulse 82    -CJ at 06/01/17 1140       Pulse Source Monitor    -CJ at 06/01/17 1140       Pulse Quality Normal    -CJ at 06/01/17 1140       Resp 17    -CJ at 06/01/17 1140       Respiration Quality Normal    -CJ at 06/01/17 1140       Temp 36.8 ?C (98.3 ?F)    -CJ at 06/01/17 1140       Temperature Source Oral

## 2017-06-02 NOTE — Medical Student
S: Stacey Boyer is a 61 y.o. Stacey Boyer who presents to clinic for follow-up of her T2DM, hyperlipidemia, and hypertension. Stacey Boyer describes that she has discontinued all of her medications 5 months ago except for HCTZ because she decided to focus on lifestyle modifications. Since her last visit in Feb of 2017, she has lost 10 pounds. She is down 30 pounds overall since she began making lifestyle modifications a few years ago. Stacey Boyer describes that she feels better than ever. She is eating smaller portions, avoiding red meats, and cutting back on carbohydrates. She also jumps rope for exercise when she can. She checks her fasting blood sugars every morning and they have been in the 95-110 range.     O: Vitals: BP 137/87, HR 82, RR 17, T 98.3, Weight 158 IBs, 5'4"  Cardio: Regular rate and rhythm, S1 and S2 sounds heard. No rubs, murmurs or gallops. Pulse was 1+ bilaterally. No edema in the legs.   Pulm: Lungs clear to auscultation bilaterally.     Her last A1C in february was 6.0, but this was when she was still on the insulin.     A: Stacey Boyer is a 61 y.o. Female who presents to clinic for follow up of chronic conditions after making appropriate lifestyle changes for re-evaluation.     P: Check A1C and fasting lipids to see if Stacey Boyer needs to be restarted on any medcations.     A1C- 7.1, Offered Metformin 500 MG once a day to patient but she declined because she wants to keep making lifestyle changes instead.   Lipid panel- Total cholesterol 331. HDL 44. Triglycerides 141. Recommend patient restarts her atorvastatin 20 MG qdaily.

## 2017-06-02 NOTE — Progress Notes
HPI       Stacey Boyer is a 61 y.o. female with a history of IDDM, lumbar spine OA, GERD, HPLD, HTN, and tobacco use disorder here for Medication Management (pt has changed diet and exercised/ stopped insulin)    [name] returns to clinic for a follow up of her weight loss regimen and dietary changes. She states that she has discontinued her medications for 5 months now.     She returns for evaluation of elevated blood pressures. Patient is adherent to low sodium diet, frequent exercise, and medications.  Cardiac symptoms included fatigue and headaches. Patient denies chest pain, exertional chest pressure/discomfort, irregular heart beats, syncope. History of target organ damage: none. Today in clinic the patients blood pressure was measured at 137/87, which is mildly elevated. At home she notes that her pressures are regularly measured at 120/84 and similar pressures. Patient reports that she will have occasional swelling in her feet when she stands in one place.     [name] has a h/o diabetes. Symptoms have gradually improved. Patient denies polydipsia, polyuria, diarrhea and visual disturbances. The patient is compliant with a low carb diet, frequent exercise, medications. Recent evaluation includes: fasting blood sugar, fasting lipid panel and hemoglobin A1C.  Home sugars: BGs range between 85 and 110. The patient has not seen podiatry within the last 12 months months. A visit with an eye doctor has not been completed. Patient has not taken her Novolog in 5 months.    [name] reports that she continues to smoke despite the health risks. She notes that she continues to smoke 1/2 a pack a day.     She presents for follow up of hyperlipidemia. Compliance with treatment has been poor. The patient exercises intermittently. [name] notes to no longer taking her Zetia 10mg .    Pain level and fall risk were assessed. Health issues since the last visit were reviewed.    Past Medical History:

## 2017-06-02 NOTE — Progress Notes
(  150 minutes of moderate intensity exercise per week), and dietary changes are encouraged. Will consider pharmacologic therapy for patients with a BMI greater than 30 kg/m2 that have failed to lose weight with diet and exercise alone.    Essential hypertension    Antihypertensive medications are needed if systolic BP is persistently ?140 mmHg (in patients <60 years) or ?150 mmHg (in patients > 60 years) and/or the diastolic pressure is persistently ?90 mmHg despite attempted nonpharmacologic therapy. Today, the patient's blood pressure is at goal. Will continue to encourage lifestyle changes.    Continue HZTZ .    Dysuria  -     POCT Urinalysis w/out microscopy inhouse-AUTO    I have ordered a urinalysis test.     Hyperlipidemia, unspecified hyperlipidemia type; Mixed hyperlipidemia  -     POCT cholesterol        -     atorvastatin (LIPITOR) 20 MG PO Tablet; Take 1 tablet by mouth daily.  Dispense: 90 tablet; Refill: 1    Will continue current medication regimen and check lipid panel q6-12 months. Patient encouraged to exercise and reduce CV risk factors. Medication adherence was stressed.    I have advised the patient to begin her Lipitor  regimen again.    I ordered a CBC, Chem12, Lipid Profile, and TSH.     Reviewed pertinent labs/diagnostics/notes with patient.     Discussed plan of care with patient. Patient is also advised to various treatment options and agreed to the following: taking medication as directed, keeping follow up visits.     Risks verses benefits of medication and treatment were discussed. Patient voiced agreement and understanding.    F/u as scheduled or sooner as needed. Patient advised to be vigilant and to report any worsening symptoms. If concerning symptoms emerge or worsen, please seek treatment at ED.     Goals     ? Exercise 150 minutes per week (moderate activity)     ? HEMOGLOBIN A1C < 7.0     ? Reduce salt intake to 2 grams per day or less           Health Maintenance

## 2017-06-02 NOTE — Progress Notes
Diagnosis Date   ? Acute pancreatitis, unspecified complication status, unspecified pancreatitis type 04/30/2016   ? Allergy to environmental factors    ? Arthritis    ? Asthma    ? Diabetes mellitus    ? GERD (gastroesophageal reflux disease)    ? Heart murmur    ? Hyperlipidemia    ? Hypertension    ? Polyneuropathy in diabetes(357.2)      Past Surgical History:   Procedure Laterality Date   ? CESAREAN SECTION     ? HERNIA REPAIR     ? HYSTERECTOMY       Family History   Problem Relation Age of Onset   ? High Blood Pressure Mother    ? Cancer Mother         breast and colon   ? Breast Cancer Mother [id number]   ? Depression Father    ? Heart Disease Father    ? High Blood Pressure Father    ? Cancer Father         brain   ? Cancer Sister         breast   ? Breast Cancer Sister [id number]   ? Cancer Maternal Aunt         throat     Social History   Substance Use Topics   ? Smoking status: Former Smoker     Packs/day: 1.00     Start date: 731992   ? Smokeless tobacco: Former NeurosurgeonUser   ? Alcohol use No     Current Outpatient Prescriptions on File Prior to Visit   Medication Sig   ? hydroCHLOROthiazide (HYDRODIURIL) 25 MG PO Tablet Take 1 tablet by mouth daily.   ? [DISCONTINUED] atorvastatin (LIPITOR) 20 MG PO Tablet TAKE 1 TABLET BY MOUTH DAILY.   ? buPROPion (WELLBUTRIN XL) 150 MG Tablet Extended Release 24 Hour Take 1 tablet by mouth every morning.   ? ezetimibe (ZETIA) 10 MG PO Tablet TAKE 1 TABLET BY MOUTH EVERY DAY   ? ibuprofen (ADVIL,MOTRIN) 600 MG Tablet Take 1 Tablet by mouth every 6 hours as needed for pain.   ? insulin aspart (NovoLOG) pen Inject 15 Units into the skin 3 times daily (before meals).   ? LEVEMIR FlexTouch INJECT 30 UNITS INTO THE SKIN NIGHTLY AT BEDTIME.   ? lisinopril (PRINIVIL,ZESTRIL) 5 MG Tablet Take 1 tablet by mouth daily.   ? NOVOFINE 32G X 6 MM Miscellaneous USE 1 AT BEDTIME AND 3 DAILY BEFORE MEALS   ? omeprazole (PriLOSEC) 40 MG Capsule Delayed Release Take 1 capsule by mouth daily.

## 2017-06-02 NOTE — Progress Notes
CREATININE 0.78 11/23/2016    GLU 61 (L) 11/23/2016    CALCIUM 9.9 11/23/2016    TPROT 7.2 04/27/2016    ALB 4.3 11/23/2016    AST 11 11/23/2016    ALT 11 11/23/2016    EGFR 83 11/23/2016    TBILI 0.4 11/23/2016    ALKPHOS 83 11/23/2016   , PSA: No results found for: PSAFREE, PSAFTRATIO, PSA, Lipid Profile    Lab Results   Component Value Date/Time    CHOL 331 (A) 06/01/2017    TRIG 141 06/01/2017    TRIG 123 11/23/2016 10:44 AM    HDL 44 06/01/2017    HDL 49 (L) 11/23/2016 10:44 AM    LDL 204 (H) 11/23/2016 10:44 AM    LDL 218 (H) 05/15/2013 11:28 AM   , Thyroid Studies    Lab Results   Component Value Date    TSH 1.00 11/23/2016    FREET4 1.1 11/23/2016    and HGBA1C:    Lab Results   Component Value Date/Time    HGBA1C 7.1 06/01/2017    HGBA1C 6.0 (H) 11/23/2016 10:44 AM       Assessment/ Plan     Type 2 diabetes mellitus without complication, with long-term current use of insulin  -     POCT glycosylated hemoglobin (Hb A1C)  -     metFORMIN (GLUCOPHAGE) 500 MG PO Tablet; Take 1 tablet by mouth daily (with breakfast). SrCr: 0.78 mg/dl (02/26 1044) [last value]  Dispense: 90 tablet; Refill: 1    Patient's glucose control is not well controlled on the current regimen. Recent HbA1c was 7.1. The patient has had issues with compliance (ie. Diet, exercise, medication adherence) and this was discussed in depth during this visit. Labs including a urine microalbumin will be obtained every 6-12 months. Diabetic foot check has not been completed.  Referrals for the annual eye exam and diabetes education has not been completed.     At this time I have deferred the patients Metformin 571m. Ms. SMassettwill begin a whole foods plant based diet at this time to further manage her conditions. We will repeat blood work in 3 months to evaluate her blood sugars.     BMI 28.0-28.9,adult; Overweight    Maintaining a healthy weight is essential. Behavior modification, exercise

## 2017-06-02 NOTE — Progress Notes
CREATININE 0.78 11/23/2016    GLU 61 (L) 11/23/2016    CALCIUM 9.9 11/23/2016    TPROT 7.2 04/27/2016    ALB 4.3 11/23/2016    AST 11 11/23/2016    ALT 11 11/23/2016    EGFR 83 11/23/2016    TBILI 0.4 11/23/2016    ALKPHOS 83 11/23/2016   , PSA: No results found for: PSAFREE, PSAFTRATIO, PSA, Lipid Profile    Lab Results   Component Value Date/Time    CHOL 331 (A) 06/01/2017    TRIG 141 06/01/2017    TRIG 123 11/23/2016 10:44 AM    HDL 44 06/01/2017    HDL 49 (L) 11/23/2016 10:44 AM    LDL 204 (H) 11/23/2016 10:44 AM    LDL 218 (H) 05/15/2013 11:28 AM   , Thyroid Studies    Lab Results   Component Value Date    TSH 1.00 11/23/2016    FREET4 1.1 11/23/2016    and HGBA1C:    Lab Results   Component Value Date/Time    HGBA1C 7.1 06/01/2017    HGBA1C 6.0 (H) 11/23/2016 10:44 AM       Assessment/ Plan     Type 2 diabetes mellitus without complication, with long-term current use of insulin  -     POCT glycosylated hemoglobin (Hb A1C)    Patient's glucose control is not well controlled on the current regimen. Recent HbA1c was 7.1. The patient has had issues with compliance (ie. Diet, exercise, medication adherence) and this was discussed in depth during this visit. Labs including a urine microalbumin will be obtained every 6-12 months. Diabetic foot check has not been completed.  Referrals for the annual eye exam and diabetes education has not been completed.     Will hold Metformin 523m. Ms. SBrunnwill begin a whole foods plant based diet at this time to further manage her conditions. We will repeat blood work in 3 months to evaluate her blood sugars.     BMI 28.0-28.9,adult; Overweight    Maintaining a healthy weight is essential. Behavior modification, exercise (150 minutes of moderate intensity exercise per week), and dietary changes are encouraged. Will consider pharmacologic therapy for patients with a BMI greater than 30 kg/m2 that have failed to lose weight with diet and exercise alone.

## 2017-06-03 DIAGNOSIS — I10 Essential (primary) hypertension: Principal | ICD-10-CM

## 2017-06-03 MED ORDER — HYDROCHLOROTHIAZIDE 25 MG PO TABS
1 refills | Status: CP
Start: 2017-06-03 — End: 2017-06-24

## 2017-06-23 DIAGNOSIS — I10 Essential (primary) hypertension: Principal | ICD-10-CM

## 2017-06-23 MED ORDER — HYDROCHLOROTHIAZIDE 25 MG PO TABS
1 refills | Status: CP
Start: 2017-06-23 — End: ?

## 2017-08-17 ENCOUNTER — Ambulatory Visit: Attending: Internal Medicine | Primary: Family Medicine

## 2017-08-17 DIAGNOSIS — E785 Hyperlipidemia, unspecified: Secondary | ICD-10-CM

## 2017-08-17 DIAGNOSIS — E119 Type 2 diabetes mellitus without complications: Secondary | ICD-10-CM

## 2017-08-17 DIAGNOSIS — I10 Essential (primary) hypertension: Secondary | ICD-10-CM

## 2017-08-17 DIAGNOSIS — Z794 Long term (current) use of insulin: Secondary | ICD-10-CM

## 2017-08-17 DIAGNOSIS — F329 Major depressive disorder, single episode, unspecified: Secondary | ICD-10-CM

## 2017-08-17 DIAGNOSIS — F419 Anxiety disorder, unspecified: Secondary | ICD-10-CM

## 2017-08-17 DIAGNOSIS — Z6827 Body mass index (BMI) 27.0-27.9, adult: Secondary | ICD-10-CM

## 2017-08-17 DIAGNOSIS — R300 Dysuria: Principal | ICD-10-CM

## 2017-08-17 DIAGNOSIS — R1033 Periumbilical pain: Secondary | ICD-10-CM

## 2017-08-17 MED ORDER — HYOSCYAMINE SULFATE 0.125 MG PO TABS
.125 mg | ORAL | 0 refills | Status: CP | PRN
Start: 2017-08-17 — End: ?

## 2017-08-17 NOTE — Progress Notes
hours as needed for pain.   ? insulin aspart (NovoLOG) pen Inject 15 Units into the skin 3 times daily (before meals).   ? LEVEMIR FlexTouch INJECT 30 UNITS INTO THE SKIN NIGHTLY AT BEDTIME.   ? lisinopril (PRINIVIL,ZESTRIL) 5 MG Tablet Take 1 tablet by mouth daily.   ? metFORMIN (GLUCOPHAGE) 500 MG PO Tablet Take 1 tablet by mouth daily (with breakfast). SrCr: 0.78 mg/dl (16/1002/26 [id number]) [last value]   ? NOVOFINE 32G X 6 MM Miscellaneous USE 1 AT BEDTIME AND 3 DAILY BEFORE MEALS   ? omeprazole (PriLOSEC) 40 MG Capsule Delayed Release Take 1 capsule by mouth daily.   ? ondansetron (ZOFRAN-ODT) 4 MG Tablet Disintegrating Dissolve 1 tablet in mouth every 6 hours as needed.   ? polyethylene glycol (GLYCOLAX) Powder MIX 2 CAPFUL (34 GM) IN 8 - 16 OUNCES OF WATER, JUICE OR TEA AND DRINK DAILY.   ? tiZANidine (ZANAFLEX) 4 MG PO Tablet Take 1 tablet by mouth nightly at bedtime as needed.   ? varenicline (CHANTIX STARTING MONTH PAK) 0.5 MG X 11 & 1 MG X 42 tablet Take one 0.5mg  tab by mouth once daily x3 days; then increase     No current facility-administered medications on file prior to visit.      Allergies   Allergen Reactions   ? No Known Drug Allergy      No Known Drug Allergies         Review of Systems  Review of Systems   Constitutional: Negative for activity change, appetite change, fatigue and unexpected weight change.   HENT: Negative for congestion, rhinorrhea and sore throat.    Eyes: Negative for photophobia, pain and visual disturbance.   Respiratory: Negative for cough, chest tightness, shortness of breath and wheezing.    Cardiovascular: Negative for chest pain, palpitations and leg swelling.   Gastrointestinal: Negative for abdominal pain, constipation, diarrhea, nausea and vomiting.   Endocrine: Negative.    Genitourinary: Positive for dysuria. Negative for flank pain, frequency, hematuria and urgency.   Musculoskeletal: Negative for arthralgias, back pain, joint swelling,

## 2017-08-17 NOTE — Progress Notes
Abdominal: Soft. She exhibits no distension and no mass. There is no tenderness. There is no rebound and no guarding.   No CVA tenderness   Neurological: She is alert and oriented to person, place, and time.   Skin: Skin is warm. She is not diaphoretic.   Psychiatric: She has a normal mood and affect. Her behavior is normal.   Vitals reviewed.       Assessment:       ICD-10-CM ICD-9-CM    1. Dysuria R30.0 788.1 POCT Urinalysis w/out microscopy inhouse-AUTO   2. Periumbilical abdominal pain R10.33 789.05 hyoscyamine (ANASPAZ,LEVSIN) 0.125 MG PO Tablet   3. Essential hypertension with goal blood pressure less than 130/85 I10 401.9    4. Type 2 diabetes mellitus without complication, with long-term current use of insulin (CMS-HCC code) E11.9 250.00 Lipid Panel    Z79.4 V58.67 HEMOGLOBIN A1C      Lipid Panel      HEMOGLOBIN A1C   5. Hyperlipidemia, unspecified hyperlipidemia type E78.5 272.4 Lipid Panel      Comprehensive Metabolic Panel      Lipid Panel      Comprehensive Metabolic Panel   6. Anxiety and depression F41.9 300.00     F32.9 311    7. Body mass index 27.0-27.9, adult Z68.27 V85.23           Plan:     A. Dysuria / abdoinal pain    1. UA:  Leuko -  Nitro -    Blood-   2. Increase fluid intake    3. Continue Protonix 40 mg    4. Add Levsin q 4 prn   5. GI follow up.   B. HTN.   ?? 1. Fairly controlled  ?????? 2. Continue Lisinopril , HCTZ   C. DM II  ?????       1. A1c 6.0 in 11/2016, 6.8 on 09/23/16, FBS 61 in 11/2016  ????????   2.Pt is off Novolog   C. HLD  ??????      1. LDL 204 in 11/2016, 102 in 2/95628/2016, TG 123 in 11/2016, 82 in 04/2015  ??????????  2. LIpitor caused severe myalgia  ?????? 3. Continue Zetia 10 mg   D. Anxiety / depression  ????? 1. controlled  ???? 2. Continue Wellbutrin   ??  ?General instructions   1. Bring all the medications on next visit  2. Continue current medications  3. Return to office if no improvement or any problems.     Orders Placed This Encounter   Medications

## 2017-08-17 NOTE — Progress Notes
Subjective:   Stacey Boyer is a 61 y.o. female being seen today for Urinary Tract Infection (possibly)       Dysuria    This is a new problem. The current episode started in the past 7 days. The problem occurs every urination. The problem has been unchanged. The quality of the pain is described as burning. The pain is mild. There has been no fever. Pertinent negatives include no flank pain, frequency, hematuria, nausea, urgency or vomiting.     Hypertension?  This is a chronic?problem. The current episode started more than 1 year ago. The problem is unchanged. The problem is controlled. Associated symptoms include headaches?and neck pain. Pertinent negatives include no chest pain, palpitations?or shortness of breath. There are no associated agents?to hypertension. Risk factors for coronary artery disease include diabetes mellitus, dyslipidemia and obesity. Treatments tried: Lisinopril ?The current treatment provides moderate?improvement. There are no compliance problems. ?  ?  Hyperlipidemia?  This is a chronic?problem. The current episode started more than 1 year ago. There are no known factors?aggravating her hyperlipidemia. Pertinent negatives include no chest pain, myalgias?or shortness of breath. She is currently on no antihyperlipidemic treatment (was on Lipitor but stopped. ). Risk factors for coronary artery disease include diabetes mellitus, dyslipidemia, hypertension and obesity.   ?  Diabetes?  She presents for her follow-up?diabetic visit. She has type 2?diabetes mellitus. Hypoglycemia symptoms include headaches. Pertinent negatives for hypoglycemia include no dizziness?or nervousness/anxiousness. There are no diabetic associated symptoms. Pertinent negatives for diabetes include no chest pain, no fatigue?and no weakness. There are no hypoglycemic complications. There are no diabetic complications. Current diabetic treatment includes insulin injections (Novolog ). She is compliant with

## 2017-08-17 NOTE — Progress Notes
?   hyoscyamine (ANASPAZ,LEVSIN) 0.125 MG PO Tablet     Sig: Take 1 tablet by mouth every 4 hours as needed for cramping.     Dispense:  24 tablet     Refill:  0     Orders Placed This Encounter   Procedures   ? Lipid Panel   ? Comprehensive Metabolic Panel   ? HEMOGLOBIN A1C   ? POCT Urinalysis w/out microscopy inhouse-AUTO       Health Maintenance was reviewed. The patient's HM Topic list was:                                            Health Maintenance   Topic Date Due   ? Zoster Vaccine (1 of 2) 10/26/2007   ? Chest CT / LDCT  10/25/2012   ? Breast Cancer Screening  10/19/2016   ? Pneumovax / Prevnar (1 of 1 - PPSV23) 01/18/2017   ? Diabetic Foot Exam  05/18/2017   ? Diabetic Eye Exam  05/18/2017   ? Influenza Vaccine (1) 05/29/2017   ? Colon Cancer Screening  10/31/2017   ? Preventive Wellness Visit  11/23/2017   ? Lipid Profile  11/23/2017   ? Urine Microalbumin  11/23/2017   ? Creatinine  11/23/2017   ? Basic Metabolic Panel  11/23/2017   ? Hemoglobin A1C  11/29/2017   ? USPSTF HIV Risk Assessment  Completed   ? USPSTF Hepatitis C Screening  Completed   ? DTaP,Tdap,and Td Vaccines  Excluded

## 2017-08-17 NOTE — Progress Notes
treatment all of the time. An ACE inhibitor/angiotensin II receptor blocker is being taken.   ?  Past Medical History:   Diagnosis Date   ? Acute pancreatitis, unspecified complication status, unspecified pancreatitis type 04/30/2016   ? Allergy to environmental factors    ? Arthritis    ? Asthma    ? Diabetes mellitus    ? GERD (gastroesophageal reflux disease)    ? Heart murmur    ? Hyperlipidemia    ? Hypertension    ? Polyneuropathy in diabetes(357.2)      Past Surgical History:   Procedure Laterality Date   ? CESAREAN SECTION     ? HERNIA REPAIR     ? HYSTERECTOMY       Family History   Problem Relation Age of Onset   ? High Blood Pressure Mother    ? Cancer Mother         breast and colon   ? Breast Cancer Mother 2460   ? Depression Father    ? Heart Disease Father    ? High Blood Pressure Father    ? Cancer Father         brain   ? Cancer Sister         breast   ? Breast Cancer Sister [id number]   ? Cancer Maternal Aunt         throat     Social History     Social History   ? Marital status: Married     Spouse name: N/A   ? Number of children: N/A   ? Years of education: N/A     Occupational History   ? Not on file.     Social History Main Topics   ? Smoking status: Former Smoker     Packs/day: 1.00     Start date: 251992   ? Smokeless tobacco: Former NeurosurgeonUser   ? Alcohol use No   ? Drug use: No   ? Sexual activity: Not on file     Other Topics Concern   ? Not on file     Social History Narrative   ? No narrative on file     Current Outpatient Prescriptions on File Prior to Visit   Medication Sig   ? atorvastatin (LIPITOR) 20 MG PO Tablet Take 1 tablet by mouth daily.   ? buPROPion (WELLBUTRIN XL) 150 MG Tablet Extended Release 24 Hour Take 1 tablet by mouth every morning.   ? ezetimibe (ZETIA) 10 MG PO Tablet TAKE 1 TABLET BY MOUTH EVERY DAY   ? hydroCHLOROthiazide (HYDRODIURIL) 25 MG PO Tablet TAKE 1 TABLET BY MOUTH EVERY DAY   ? ibuprofen (ADVIL,MOTRIN) 600 MG Tablet Take 1 Tablet by mouth every 6

## 2017-08-17 NOTE — Progress Notes
myalgias, neck pain and neck stiffness.   Skin: Negative for color change, rash and wound.   Allergic/Immunologic: Negative.    Neurological: Negative for dizziness, weakness, numbness and headaches.   Hematological: Negative for adenopathy. Does not bruise/bleed easily.   Psychiatric/Behavioral: Negative for sleep disturbance. The patient is not nervous/anxious.            Objective:        VITAL SIGNS (all recorded)      Clinic Vitals     Row Name 08/17/17 1158                Amb Encounter Vitals    Weight 71.2 kg (157 lb)    -DT at 08/17/17 1158       Height 1.626 m (5\' 4" )    -DT at 08/17/17 1158       BMI (Calculated) 27.01    -DT at 08/17/17 1158       BSA (Calculated - sq m) 1.79    -DT at 08/17/17 1158       BP 137/89    -DT at 08/17/17 1205       BP Location Left upper arm    -DT at 08/17/17 1203       Position Sitting    -DT at 08/17/17 1203       Pulse 85    -DT at 08/17/17 1205       Pulse Source Monitor    -DT at 08/17/17 1203       Pulse Quality Normal    -DT at 08/17/17 1203       Resp 20    -DT at 08/17/17 1205       Respiration Quality Normal    -DT at 08/17/17 1203       Temp 35.6 ?C (96 ?F)    -DT at 08/17/17 1205       Temperature Source Oral    -DT at 08/17/17 1203       Pain Score Five    -DT at 08/17/17 1205       Location stomach    -DT at 08/17/17 1205          Education/Communication Barriers?    Learning/Communication Barriers? No    -DT at 08/17/17 1205          Fall Risk Assessment    Had recent fall / Last 6 months? No recent fall    -DT at 08/17/17 1205       Does patient have a fear of falling? No    -DT at 08/17/17 1205         User Key  (r) = Recorded By, (t) = Taken By, (c) = Cosigned By    Initials Name Effective Dates    DT Boyer Asaalley, Stacey -        Physical Exam   Constitutional: She is oriented to person, place, and time. She appears well-developed and well-nourished. No distress.   HENT:   Head: Normocephalic and atraumatic.   Eyes: No scleral icterus.   Neck: Neck supple.

## 2017-08-31 ENCOUNTER — Encounter: Attending: Family Medicine | Primary: Family Medicine

## 2017-11-08 DIAGNOSIS — M542 Cervicalgia: Principal | ICD-10-CM

## 2017-11-09 MED ORDER — TIZANIDINE HCL 4 MG PO TABS
4 mg | Freq: Every evening | ORAL | 2 refills | Status: CP | PRN
Start: 2017-11-09 — End: ?

## 2017-11-24 ENCOUNTER — Encounter: Attending: Physician Assistant | Primary: Family Medicine

## 2018-02-07 ENCOUNTER — Encounter: Attending: Family | Primary: Family Medicine

## 2018-03-01 DIAGNOSIS — E782 Mixed hyperlipidemia: Principal | ICD-10-CM

## 2018-03-01 MED ORDER — ATORVASTATIN CALCIUM 20 MG PO TABS
1 refills | Status: CP
Start: 2018-03-01 — End: ?

## 2019-10-26 ENCOUNTER — Ambulatory Visit: Payer: BLUE CROSS/BLUE SHIELD | Attending: Emergency Medicine | Primary: Family Medicine

## 2019-10-26 ENCOUNTER — Encounter: Payer: BLUE CROSS/BLUE SHIELD | Primary: Family Medicine

## 2019-10-26 DIAGNOSIS — R011 Cardiac murmur, unspecified: Secondary | ICD-10-CM

## 2019-10-26 DIAGNOSIS — R1030 Lower abdominal pain, unspecified: Principal | ICD-10-CM

## 2019-10-26 DIAGNOSIS — K859 Acute pancreatitis without necrosis or infection, unspecified: Secondary | ICD-10-CM

## 2019-10-26 DIAGNOSIS — E1142 Type 2 diabetes mellitus with diabetic polyneuropathy: Secondary | ICD-10-CM

## 2019-10-26 DIAGNOSIS — K219 Gastro-esophageal reflux disease without esophagitis: Secondary | ICD-10-CM

## 2019-10-26 DIAGNOSIS — J45909 Unspecified asthma, uncomplicated: Secondary | ICD-10-CM

## 2019-10-26 DIAGNOSIS — E785 Hyperlipidemia, unspecified: Secondary | ICD-10-CM

## 2019-10-26 DIAGNOSIS — E119 Type 2 diabetes mellitus without complications: Principal | ICD-10-CM

## 2019-10-26 DIAGNOSIS — Z9109 Other allergy status, other than to drugs and biological substances: Secondary | ICD-10-CM

## 2019-10-26 DIAGNOSIS — M1990 Unspecified osteoarthritis, unspecified site: Secondary | ICD-10-CM

## 2019-10-26 DIAGNOSIS — K5732 Diverticulitis of large intestine without perforation or abscess without bleeding: Secondary | ICD-10-CM

## 2019-10-26 DIAGNOSIS — I10 Essential (primary) hypertension: Secondary | ICD-10-CM

## 2019-10-26 MED ORDER — SODIUM CHLORIDE 0.9% FOR FLUSHES
20-180 mL | Status: CP | PRN
Start: 2019-10-26 — End: ?

## 2019-10-26 MED ORDER — BOLUS IV FLUID JX
Freq: Once | INTRAVENOUS | Status: CP
Start: 2019-10-26 — End: ?

## 2019-10-26 MED ORDER — HYOSCYAMINE SULFATE 0.125 MG SL SUBL
125 ug | ORAL | 0 refills | 5.00 days | Status: CP | PRN
Start: 2019-10-26 — End: ?

## 2019-10-26 MED ORDER — CIPROFLOXACIN HCL 500 MG PO TABS
500 mg | Freq: Two times a day (BID) | ORAL | 0 refills | 7.00000 days | Status: CP
Start: 2019-10-26 — End: ?

## 2019-10-26 MED ORDER — PANTOPRAZOLE SODIUM 40 MG PO TBEC
40 mg | Freq: Every evening | ORAL
Start: 2019-10-26 — End: ?

## 2019-10-26 MED ORDER — KETOROLAC TROMETHAMINE 30 MG/ML IJ/IM SOLN JX
10 mg | Freq: Once | INTRAMUSCULAR | Status: CP
Start: 2019-10-26 — End: ?

## 2019-10-26 MED ORDER — ONDANSETRON 8 MG PO TBDP
8 mg | Freq: Two times a day (BID) | ORAL | 0 refills | 10.00 days | Status: CP | PRN
Start: 2019-10-26 — End: ?

## 2019-10-26 MED ORDER — KETOROLAC TROMETHAMINE 30 MG/ML IJ/IM SOLN JX
30 mg | Freq: Once | INTRAMUSCULAR | Status: DC
Start: 2019-10-26 — End: 2019-10-26

## 2019-10-26 MED ORDER — CELECOXIB 200 MG PO CAPS
200 mg | Freq: Two times a day (BID) | ORAL | 0 refills | Status: CP | PRN
Start: 2019-10-26 — End: ?

## 2019-10-26 MED ORDER — IOHEXOL 350 MG/ML IV SOLN SH
100 mL | Freq: Once | INTRAVENOUS | Status: CP
Start: 2019-10-26 — End: ?

## 2019-10-26 MED ORDER — METRONIDAZOLE 500 MG PO TABS
500 mg | Freq: Two times a day (BID) | ORAL | 0 refills | Status: CP
Start: 2019-10-26 — End: ?

## 2019-10-26 MED ORDER — ONDANSETRON HCL 4 MG/2ML IJ SOLN
4 mg | Freq: Once | INTRAVENOUS | Status: CP
Start: 2019-10-26 — End: ?

## 2020-01-13 ENCOUNTER — Ambulatory Visit: Payer: BLUE CROSS/BLUE SHIELD | Attending: Emergency Medicine | Primary: Family Medicine

## 2020-01-13 ENCOUNTER — Encounter: Payer: BLUE CROSS/BLUE SHIELD | Primary: Family Medicine

## 2020-01-13 DIAGNOSIS — K5792 Diverticulitis of intestine, part unspecified, without perforation or abscess without bleeding: Secondary | ICD-10-CM

## 2020-01-13 DIAGNOSIS — K859 Acute pancreatitis without necrosis or infection, unspecified: Secondary | ICD-10-CM

## 2020-01-13 DIAGNOSIS — R011 Cardiac murmur, unspecified: Secondary | ICD-10-CM

## 2020-01-13 DIAGNOSIS — I10 Essential (primary) hypertension: Secondary | ICD-10-CM

## 2020-01-13 DIAGNOSIS — Z9109 Other allergy status, other than to drugs and biological substances: Secondary | ICD-10-CM

## 2020-01-13 DIAGNOSIS — E119 Type 2 diabetes mellitus without complications: Principal | ICD-10-CM

## 2020-01-13 DIAGNOSIS — K219 Gastro-esophageal reflux disease without esophagitis: Secondary | ICD-10-CM

## 2020-01-13 DIAGNOSIS — M1990 Unspecified osteoarthritis, unspecified site: Secondary | ICD-10-CM

## 2020-01-13 DIAGNOSIS — E1142 Type 2 diabetes mellitus with diabetic polyneuropathy: Secondary | ICD-10-CM

## 2020-01-13 DIAGNOSIS — J45909 Unspecified asthma, uncomplicated: Secondary | ICD-10-CM

## 2020-01-13 DIAGNOSIS — E785 Hyperlipidemia, unspecified: Secondary | ICD-10-CM

## 2020-01-13 DIAGNOSIS — R1032 Left lower quadrant pain: Principal | ICD-10-CM

## 2020-01-13 MED ORDER — DICYCLOMINE HCL 20 MG PO TABS
20 mg | Freq: Four times a day (QID) | ORAL | 0 refills | Status: CP
Start: 2020-01-13 — End: ?

## 2020-01-13 MED ORDER — IOHEXOL 350 MG/ML IV SOLN SH
100 mL | Freq: Once | INTRAVENOUS | Status: CP
Start: 2020-01-13 — End: ?

## 2020-01-13 MED ORDER — SODIUM CHLORIDE 0.9% FOR FLUSHES
20-180 mL | Status: CP | PRN
Start: 2020-01-13 — End: ?

## 2020-01-13 MED ORDER — CIPROFLOXACIN HCL 500 MG PO TABS
500 mg | Freq: Two times a day (BID) | ORAL | 0 refills | Status: CP
Start: 2020-01-13 — End: ?

## 2020-01-13 MED ORDER — METRONIDAZOLE 500 MG PO TABS
500 mg | Freq: Two times a day (BID) | ORAL | 0 refills | Status: CP
Start: 2020-01-13 — End: ?

## 2020-01-13 MED ORDER — HYDROCODONE-ACETAMINOPHEN 5-325 MG PO TABS
1 | ORAL_TABLET | Freq: Four times a day (QID) | ORAL | 0 refills | 30.00000 days | Status: CP | PRN
Start: 2020-01-13 — End: ?

## 2020-01-29 ENCOUNTER — Ambulatory Visit: Admit: 2020-01-29 | Discharge: 2020-01-29 | Attending: Family Medicine

## 2020-01-29 DIAGNOSIS — I10 Essential (primary) hypertension: Secondary | ICD-10-CM

## 2020-01-29 DIAGNOSIS — E119 Type 2 diabetes mellitus without complications: Secondary | ICD-10-CM

## 2020-01-29 DIAGNOSIS — K219 Gastro-esophageal reflux disease without esophagitis: Secondary | ICD-10-CM

## 2020-01-29 DIAGNOSIS — R011 Cardiac murmur, unspecified: Secondary | ICD-10-CM

## 2020-01-29 DIAGNOSIS — Z9109 Other allergy status, other than to drugs and biological substances: Secondary | ICD-10-CM

## 2020-01-29 DIAGNOSIS — K859 Acute pancreatitis without necrosis or infection, unspecified: Secondary | ICD-10-CM

## 2020-01-29 DIAGNOSIS — Z720 Tobacco use: Secondary | ICD-10-CM

## 2020-01-29 DIAGNOSIS — E785 Hyperlipidemia, unspecified: Secondary | ICD-10-CM

## 2020-01-29 DIAGNOSIS — Z0000 Encounter for general adult medical examination without abnormal findings: Principal | ICD-10-CM

## 2020-01-29 DIAGNOSIS — M1990 Unspecified osteoarthritis, unspecified site: Secondary | ICD-10-CM

## 2020-01-29 DIAGNOSIS — E782 Mixed hyperlipidemia: Secondary | ICD-10-CM

## 2020-01-29 DIAGNOSIS — J45909 Unspecified asthma, uncomplicated: Secondary | ICD-10-CM

## 2020-01-29 DIAGNOSIS — Z6827 Body mass index (BMI) 27.0-27.9, adult: Secondary | ICD-10-CM

## 2020-01-29 DIAGNOSIS — K5792 Diverticulitis of intestine, part unspecified, without perforation or abscess without bleeding: Secondary | ICD-10-CM

## 2020-01-29 DIAGNOSIS — E1142 Type 2 diabetes mellitus with diabetic polyneuropathy: Secondary | ICD-10-CM

## 2020-01-29 DIAGNOSIS — K921 Melena: Secondary | ICD-10-CM

## 2020-01-29 MED ORDER — LEVOFLOXACIN 500 MG PO TABS
500 mg | Freq: Every day | ORAL | 0 refills | Status: CP
Start: 2020-01-29 — End: ?

## 2020-01-29 MED ORDER — VITAMIN D 50 MCG (2000 UT) PO TABS
ORAL
Start: 2020-01-29 — End: ?

## 2020-01-29 MED ORDER — GABAPENTIN 300 MG PO CAPS
300 mg | Freq: Every evening | ORAL
Start: 2020-01-29 — End: ?

## 2020-02-04 DIAGNOSIS — Z9109 Other allergy status, other than to drugs and biological substances: Secondary | ICD-10-CM

## 2020-02-04 DIAGNOSIS — E1142 Type 2 diabetes mellitus with diabetic polyneuropathy: Secondary | ICD-10-CM

## 2020-02-04 DIAGNOSIS — R011 Cardiac murmur, unspecified: Secondary | ICD-10-CM

## 2020-02-04 DIAGNOSIS — E119 Type 2 diabetes mellitus without complications: Principal | ICD-10-CM

## 2020-02-04 DIAGNOSIS — E785 Hyperlipidemia, unspecified: Secondary | ICD-10-CM

## 2020-02-04 DIAGNOSIS — M1990 Unspecified osteoarthritis, unspecified site: Secondary | ICD-10-CM

## 2020-02-04 DIAGNOSIS — K219 Gastro-esophageal reflux disease without esophagitis: Secondary | ICD-10-CM

## 2020-02-04 DIAGNOSIS — I10 Essential (primary) hypertension: Secondary | ICD-10-CM

## 2020-02-04 DIAGNOSIS — J45909 Unspecified asthma, uncomplicated: Secondary | ICD-10-CM

## 2020-02-04 DIAGNOSIS — K859 Acute pancreatitis without necrosis or infection, unspecified: Secondary | ICD-10-CM

## 2020-03-14 ENCOUNTER — Ambulatory Visit: Admit: 2020-03-14 | Discharge: 2020-03-14 | Attending: Emergency Medicine

## 2020-03-14 DIAGNOSIS — E1142 Type 2 diabetes mellitus with diabetic polyneuropathy: Secondary | ICD-10-CM

## 2020-03-14 DIAGNOSIS — E785 Hyperlipidemia, unspecified: Secondary | ICD-10-CM

## 2020-03-14 DIAGNOSIS — M1990 Unspecified osteoarthritis, unspecified site: Secondary | ICD-10-CM

## 2020-03-14 DIAGNOSIS — R109 Unspecified abdominal pain: Principal | ICD-10-CM

## 2020-03-14 DIAGNOSIS — K5792 Diverticulitis of intestine, part unspecified, without perforation or abscess without bleeding: Secondary | ICD-10-CM

## 2020-03-14 DIAGNOSIS — J45909 Unspecified asthma, uncomplicated: Secondary | ICD-10-CM

## 2020-03-14 DIAGNOSIS — K859 Acute pancreatitis without necrosis or infection, unspecified: Secondary | ICD-10-CM

## 2020-03-14 DIAGNOSIS — R011 Cardiac murmur, unspecified: Secondary | ICD-10-CM

## 2020-03-14 DIAGNOSIS — I10 Essential (primary) hypertension: Secondary | ICD-10-CM

## 2020-03-14 DIAGNOSIS — Z9109 Other allergy status, other than to drugs and biological substances: Secondary | ICD-10-CM

## 2020-03-14 DIAGNOSIS — E119 Type 2 diabetes mellitus without complications: Principal | ICD-10-CM

## 2020-03-14 DIAGNOSIS — K219 Gastro-esophageal reflux disease without esophagitis: Secondary | ICD-10-CM

## 2020-03-14 MED ORDER — METRONIDAZOLE 500 MG PO TABS
500 mg | Freq: Three times a day (TID) | ORAL | 0 refills | Status: CP
Start: 2020-03-14 — End: ?

## 2020-03-14 MED ORDER — HYOSCYAMINE SULFATE 0.125 MG SL SUBL
125 ug | ORAL | 0 refills | Status: CP | PRN
Start: 2020-03-14 — End: ?

## 2020-03-14 MED ORDER — CIPROFLOXACIN HCL 500 MG PO TABS
500 mg | Freq: Two times a day (BID) | ORAL | 0 refills | Status: CP
Start: 2020-03-14 — End: ?

## 2020-05-22 DIAGNOSIS — K911 Postgastric surgery syndromes: Secondary | ICD-10-CM

## 2020-05-22 DIAGNOSIS — E119 Type 2 diabetes mellitus without complications: Principal | ICD-10-CM

## 2020-05-22 DIAGNOSIS — R011 Cardiac murmur, unspecified: Secondary | ICD-10-CM

## 2020-05-22 DIAGNOSIS — K859 Acute pancreatitis without necrosis or infection, unspecified: Secondary | ICD-10-CM

## 2020-05-22 DIAGNOSIS — Z23 Encounter for immunization: Secondary | ICD-10-CM

## 2020-05-22 DIAGNOSIS — Z9049 Acquired absence of other specified parts of digestive tract: Secondary | ICD-10-CM

## 2020-05-22 DIAGNOSIS — I10 Essential (primary) hypertension: Secondary | ICD-10-CM

## 2020-05-22 DIAGNOSIS — Z6826 Body mass index (BMI) 26.0-26.9, adult: Secondary | ICD-10-CM

## 2020-05-22 DIAGNOSIS — K219 Gastro-esophageal reflux disease without esophagitis: Secondary | ICD-10-CM

## 2020-05-22 DIAGNOSIS — T819XXA Unspecified complication of procedure, initial encounter: Principal | ICD-10-CM

## 2020-05-22 DIAGNOSIS — Z9109 Other allergy status, other than to drugs and biological substances: Secondary | ICD-10-CM

## 2020-05-22 DIAGNOSIS — E1142 Type 2 diabetes mellitus with diabetic polyneuropathy: Secondary | ICD-10-CM

## 2020-05-22 DIAGNOSIS — E785 Hyperlipidemia, unspecified: Secondary | ICD-10-CM

## 2020-05-22 DIAGNOSIS — J45909 Unspecified asthma, uncomplicated: Secondary | ICD-10-CM

## 2020-05-22 DIAGNOSIS — M1990 Unspecified osteoarthritis, unspecified site: Secondary | ICD-10-CM

## 2020-05-23 MED ORDER — ZOSTER VAC RECOMB ADJUVANTED 50 MCG/0.5ML IM SUSR
.5 mL | Freq: Once | INTRAMUSCULAR | 0 refills | Status: CP
Start: 2020-05-23 — End: ?

## 2020-05-26 DIAGNOSIS — R011 Cardiac murmur, unspecified: Secondary | ICD-10-CM

## 2020-05-26 DIAGNOSIS — E785 Hyperlipidemia, unspecified: Secondary | ICD-10-CM

## 2020-05-26 DIAGNOSIS — M1990 Unspecified osteoarthritis, unspecified site: Secondary | ICD-10-CM

## 2020-05-26 DIAGNOSIS — J45909 Unspecified asthma, uncomplicated: Secondary | ICD-10-CM

## 2020-05-26 DIAGNOSIS — E1142 Type 2 diabetes mellitus with diabetic polyneuropathy: Secondary | ICD-10-CM

## 2020-05-26 DIAGNOSIS — K219 Gastro-esophageal reflux disease without esophagitis: Secondary | ICD-10-CM

## 2020-05-26 DIAGNOSIS — E119 Type 2 diabetes mellitus without complications: Principal | ICD-10-CM

## 2020-05-26 DIAGNOSIS — Z9109 Other allergy status, other than to drugs and biological substances: Secondary | ICD-10-CM

## 2020-05-26 DIAGNOSIS — I10 Essential (primary) hypertension: Secondary | ICD-10-CM

## 2020-05-26 DIAGNOSIS — K859 Acute pancreatitis without necrosis or infection, unspecified: Secondary | ICD-10-CM

## 2020-07-19 ENCOUNTER — Encounter: Payer: BLUE CROSS/BLUE SHIELD | Attending: Family | Primary: Family Medicine

## 2020-07-19 DIAGNOSIS — Z1231 Encounter for screening mammogram for malignant neoplasm of breast: Principal | ICD-10-CM

## 2020-07-26 ENCOUNTER — Encounter: Payer: BLUE CROSS/BLUE SHIELD | Attending: Family Medicine | Primary: Family Medicine

## 2020-10-09 ENCOUNTER — Ambulatory Visit: Payer: BLUE CROSS/BLUE SHIELD | Attending: Family Medicine | Primary: Family Medicine

## 2020-10-09 ENCOUNTER — Ambulatory Visit: Primary: Family Medicine

## 2020-10-09 DIAGNOSIS — R112 Nausea with vomiting, unspecified: Secondary | ICD-10-CM

## 2020-10-09 DIAGNOSIS — M1990 Unspecified osteoarthritis, unspecified site: Secondary | ICD-10-CM

## 2020-10-09 DIAGNOSIS — J45909 Unspecified asthma, uncomplicated: Secondary | ICD-10-CM

## 2020-10-09 DIAGNOSIS — E119 Type 2 diabetes mellitus without complications: Secondary | ICD-10-CM

## 2020-10-09 DIAGNOSIS — K859 Acute pancreatitis without necrosis or infection, unspecified: Secondary | ICD-10-CM

## 2020-10-09 DIAGNOSIS — G4700 Insomnia, unspecified: Secondary | ICD-10-CM

## 2020-10-09 DIAGNOSIS — R1010 Upper abdominal pain, unspecified: Principal | ICD-10-CM

## 2020-10-09 DIAGNOSIS — M62838 Other muscle spasm: Secondary | ICD-10-CM

## 2020-10-09 DIAGNOSIS — Z6828 Body mass index (BMI) 28.0-28.9, adult: Secondary | ICD-10-CM

## 2020-10-09 DIAGNOSIS — M5030 Other cervical disc degeneration, unspecified cervical region: Secondary | ICD-10-CM

## 2020-10-09 DIAGNOSIS — R109 Unspecified abdominal pain: Secondary | ICD-10-CM

## 2020-10-09 DIAGNOSIS — F4321 Adjustment disorder with depressed mood: Secondary | ICD-10-CM

## 2020-10-09 DIAGNOSIS — M542 Cervicalgia: Secondary | ICD-10-CM

## 2020-10-09 DIAGNOSIS — B373 Candidiasis of vulva and vagina: Secondary | ICD-10-CM

## 2020-10-09 DIAGNOSIS — K219 Gastro-esophageal reflux disease without esophagitis: Secondary | ICD-10-CM

## 2020-10-09 DIAGNOSIS — Z9109 Other allergy status, other than to drugs and biological substances: Secondary | ICD-10-CM

## 2020-10-09 DIAGNOSIS — M67459 Ganglion, unspecified hip: Secondary | ICD-10-CM

## 2020-10-09 DIAGNOSIS — E1142 Type 2 diabetes mellitus with diabetic polyneuropathy: Secondary | ICD-10-CM

## 2020-10-09 DIAGNOSIS — I10 Essential (primary) hypertension: Secondary | ICD-10-CM

## 2020-10-09 DIAGNOSIS — R933 Abnormal findings on diagnostic imaging of other parts of digestive tract: Secondary | ICD-10-CM

## 2020-10-09 DIAGNOSIS — R748 Abnormal levels of other serum enzymes: Secondary | ICD-10-CM

## 2020-10-09 DIAGNOSIS — R011 Cardiac murmur, unspecified: Secondary | ICD-10-CM

## 2020-10-09 DIAGNOSIS — Z6830 Body mass index (BMI) 30.0-30.9, adult: Secondary | ICD-10-CM

## 2020-10-09 DIAGNOSIS — E785 Hyperlipidemia, unspecified: Secondary | ICD-10-CM

## 2020-10-09 DIAGNOSIS — J310 Chronic rhinitis: Secondary | ICD-10-CM

## 2020-10-09 MED ORDER — TRAMADOL HCL 50 MG PO TABS
50 mg | Freq: Four times a day (QID) | ORAL | 0 refills | 13.50000 days | Status: CP | PRN
Start: 2020-10-09 — End: ?

## 2020-10-09 MED ORDER — METFORMIN HCL 1000 MG PO TABS
1000 mg | Freq: Every day | ORAL | 3 refills | 90.00 days | Status: CP
Start: 2020-10-09 — End: ?

## 2020-10-09 MED ORDER — CYCLOBENZAPRINE HCL 5 MG PO TABS
5 mg | Freq: Every evening | ORAL | 2 refills | Status: CP
Start: 2020-10-09 — End: ?

## 2020-10-11 DIAGNOSIS — K219 Gastro-esophageal reflux disease without esophagitis: Secondary | ICD-10-CM

## 2020-10-11 DIAGNOSIS — K859 Acute pancreatitis without necrosis or infection, unspecified: Secondary | ICD-10-CM

## 2020-10-11 DIAGNOSIS — J45909 Unspecified asthma, uncomplicated: Secondary | ICD-10-CM

## 2020-10-11 DIAGNOSIS — E119 Type 2 diabetes mellitus without complications: Principal | ICD-10-CM

## 2020-10-11 DIAGNOSIS — M1990 Unspecified osteoarthritis, unspecified site: Secondary | ICD-10-CM

## 2020-10-11 DIAGNOSIS — E785 Hyperlipidemia, unspecified: Secondary | ICD-10-CM

## 2020-10-11 DIAGNOSIS — R011 Cardiac murmur, unspecified: Secondary | ICD-10-CM

## 2020-10-11 DIAGNOSIS — E1142 Type 2 diabetes mellitus with diabetic polyneuropathy: Secondary | ICD-10-CM

## 2020-10-11 DIAGNOSIS — Z9109 Other allergy status, other than to drugs and biological substances: Secondary | ICD-10-CM

## 2020-10-11 DIAGNOSIS — I10 Essential (primary) hypertension: Secondary | ICD-10-CM

## 2020-10-25 MED ORDER — PANTOPRAZOLE SODIUM 40 MG PO TBEC
40 mg | Freq: Every evening | ORAL | 1 refills | Status: CP
Start: 2020-10-25 — End: ?

## 2020-10-28 DIAGNOSIS — Z720 Tobacco use: Principal | ICD-10-CM

## 2020-10-29 MED ORDER — CHANTIX STARTING MONTH PAK 0.5 MG X 11 & 1 MG X 42 PO TABS
ORAL_TABLET | 0 refills
Start: 2020-10-29 — End: ?

## 2020-11-04 ENCOUNTER — Ambulatory Visit: Payer: BLUE CROSS/BLUE SHIELD | Attending: Family | Primary: Family Medicine

## 2020-11-04 DIAGNOSIS — I10 Essential (primary) hypertension: Secondary | ICD-10-CM

## 2020-11-04 DIAGNOSIS — J45909 Unspecified asthma, uncomplicated: Secondary | ICD-10-CM

## 2020-11-04 DIAGNOSIS — Z716 Tobacco abuse counseling: Secondary | ICD-10-CM

## 2020-11-04 DIAGNOSIS — Z720 Tobacco use: Secondary | ICD-10-CM

## 2020-11-04 DIAGNOSIS — M1990 Unspecified osteoarthritis, unspecified site: Secondary | ICD-10-CM

## 2020-11-04 DIAGNOSIS — K859 Acute pancreatitis without necrosis or infection, unspecified: Secondary | ICD-10-CM

## 2020-11-04 DIAGNOSIS — Z6828 Body mass index (BMI) 28.0-28.9, adult: Principal | ICD-10-CM

## 2020-11-04 DIAGNOSIS — E785 Hyperlipidemia, unspecified: Secondary | ICD-10-CM

## 2020-11-04 DIAGNOSIS — K219 Gastro-esophageal reflux disease without esophagitis: Secondary | ICD-10-CM

## 2020-11-04 DIAGNOSIS — E1142 Type 2 diabetes mellitus with diabetic polyneuropathy: Secondary | ICD-10-CM

## 2020-11-04 DIAGNOSIS — R011 Cardiac murmur, unspecified: Secondary | ICD-10-CM

## 2020-11-04 DIAGNOSIS — Z9109 Other allergy status, other than to drugs and biological substances: Secondary | ICD-10-CM

## 2020-11-04 DIAGNOSIS — E119 Type 2 diabetes mellitus without complications: Principal | ICD-10-CM

## 2020-11-04 DIAGNOSIS — Z712 Person consulting for explanation of examination or test findings: Secondary | ICD-10-CM

## 2020-11-04 MED ORDER — CHANTIX STARTING MONTH PAK 0.5 MG X 11 & 1 MG X 42 PO TABS
ORAL_TABLET | 0 refills | Status: CP
Start: 2020-11-04 — End: ?

## 2020-11-20 ENCOUNTER — Encounter: Payer: BLUE CROSS/BLUE SHIELD | Attending: Family Medicine | Primary: Family Medicine

## 2020-11-26 DIAGNOSIS — Z720 Tobacco use: Principal | ICD-10-CM

## 2021-01-01 DIAGNOSIS — Z720 Tobacco use: Secondary | ICD-10-CM

## 2021-01-01 DIAGNOSIS — M62838 Other muscle spasm: Principal | ICD-10-CM

## 2021-01-01 MED ORDER — VARENICLINE TARTRATE 1 MG PO TABS: Start: 2021-01-01 — End: ?

## 2021-01-02 MED ORDER — VARENICLINE TARTRATE 1 MG PO TABS
1 mg | Freq: Two times a day (BID) | ORAL | 1 refills | 28.00000 days | Status: CP
Start: 2021-01-02 — End: ?

## 2021-01-02 MED ORDER — CYCLOBENZAPRINE HCL 5 MG PO TABS
5 mg | Freq: Every evening | ORAL | 2 refills | Status: CP
Start: 2021-01-02 — End: ?

## 2021-01-17 DIAGNOSIS — Z720 Tobacco use: Principal | ICD-10-CM

## 2021-01-20 MED ORDER — VARENICLINE TARTRATE 1 MG PO TABS
1 mg | Freq: Two times a day (BID) | ORAL | 1 refills | 28.00000 days | Status: CP
Start: 2021-01-20 — End: ?

## 2021-02-25 DIAGNOSIS — I10 Essential (primary) hypertension: Secondary | ICD-10-CM

## 2021-02-25 MED ORDER — HYDROCHLOROTHIAZIDE 25 MG PO TABS
25 mg | Freq: Every day | ORAL | 0 refills
Start: 2021-02-25 — End: ?

## 2021-02-25 MED ORDER — HYDROCHLOROTHIAZIDE 25 MG PO TABS
25 mg | Freq: Every day | ORAL
Start: 2021-02-25 — End: ?

## 2021-02-25 MED ORDER — METFORMIN HCL 1000 MG PO TABS
1000 mg | Freq: Every day | ORAL | 3 refills
Start: 2021-02-25 — End: ?

## 2021-05-29 MED ORDER — METFORMIN HCL 1000 MG PO TABS
1 refills | Status: CP
Start: 2021-05-29 — End: ?

## 2021-09-21 DIAGNOSIS — Z720 Tobacco use: Principal | ICD-10-CM

## 2021-09-23 MED ORDER — VARENICLINE TARTRATE 1 MG PO TABS
1 refills | Status: CP
Start: 2021-09-23 — End: ?

## 2021-10-18 DIAGNOSIS — Z720 Tobacco use: Principal | ICD-10-CM

## 2021-10-18 MED ORDER — VARENICLINE TARTRATE 1 MG PO TABS
1 refills
Start: 2021-10-18 — End: ?

## 2021-12-22 MED ORDER — METFORMIN HCL 1000 MG PO TABS
1 refills | Status: CP
Start: 2021-12-22 — End: ?

## 2022-04-26 MED ORDER — METFORMIN HCL 1000 MG PO TABS
1 refills | Status: CP
Start: 2022-04-26 — End: ?

## 2023-07-11 IMAGING — MG MM DIGITAL SCREENING BILAT W/ TOMO AND CAD
8 series · 8 of 24 positions shown · non-contrast
Comparison: Previous exam(s).

CLINICAL DATA: Screening.

EXAM:
DIGITAL SCREENING BILATERAL MAMMOGRAM WITH TOMOSYNTHESIS AND CAD
TECHNIQUE: Bilateral screening digital craniocaudal and mediolateral oblique
mammograms were obtained. Bilateral screening digital breast
tomosynthesis was performed. The images were evaluated with
computer-aided detection.

[R CC synth-2D]
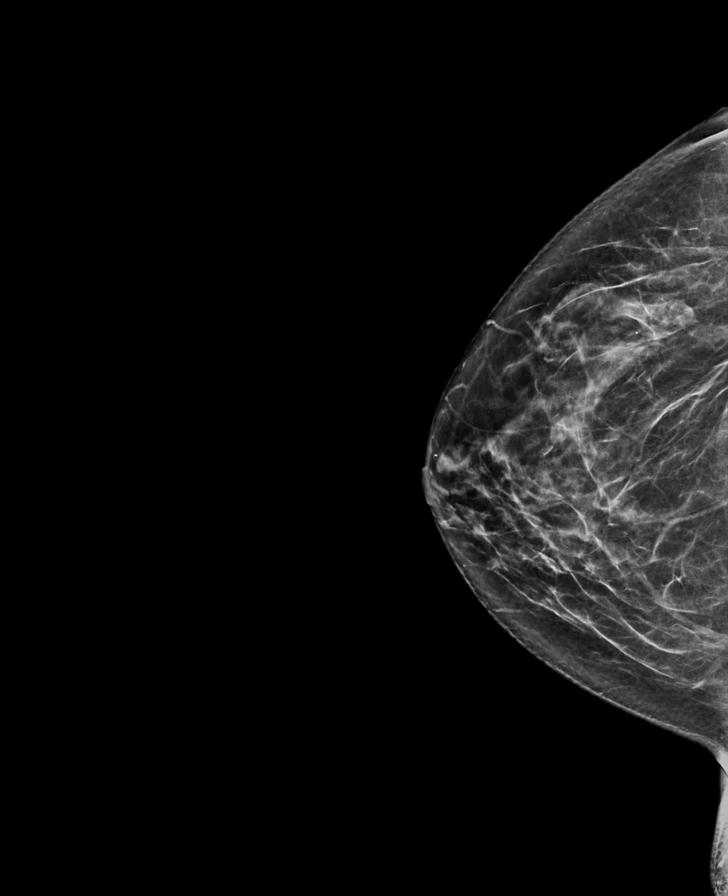

[R MLO synth-2D]
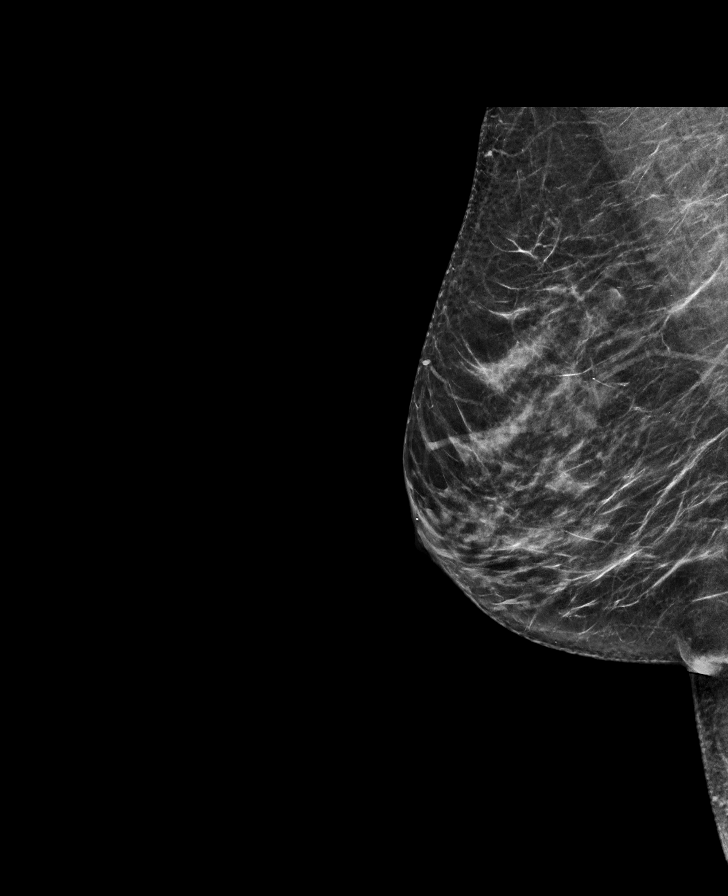

[L MLO synth-2D]
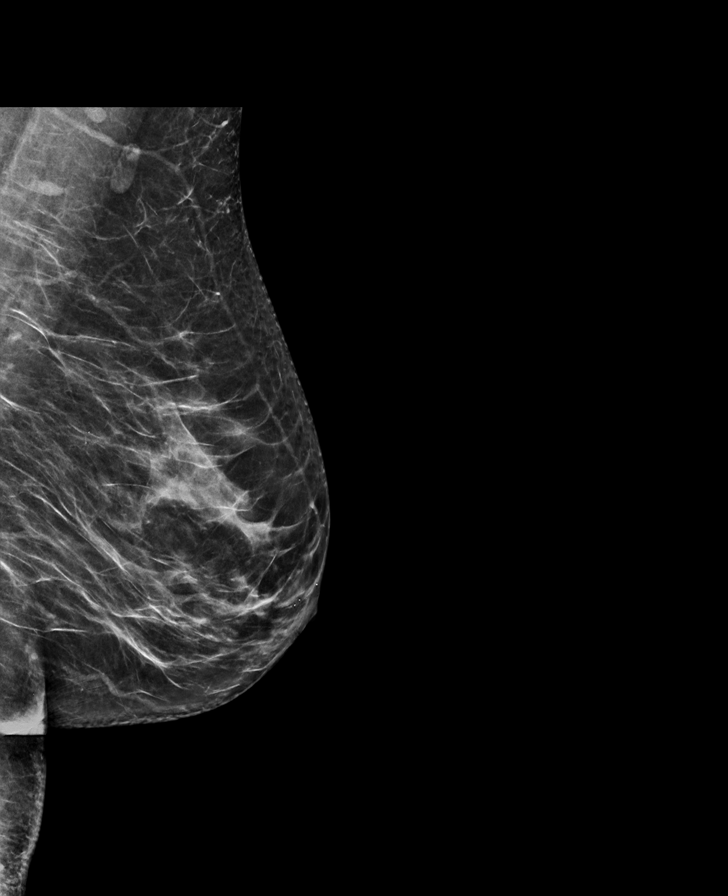

[L CC synth-2D]
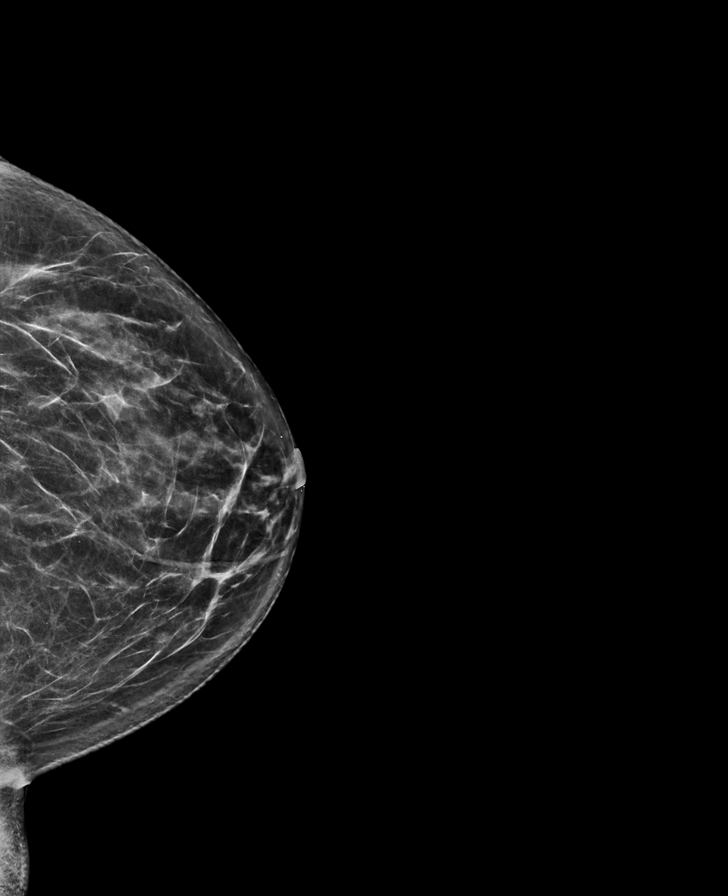

[R MLO tomo · tomo slice 33/64.0]
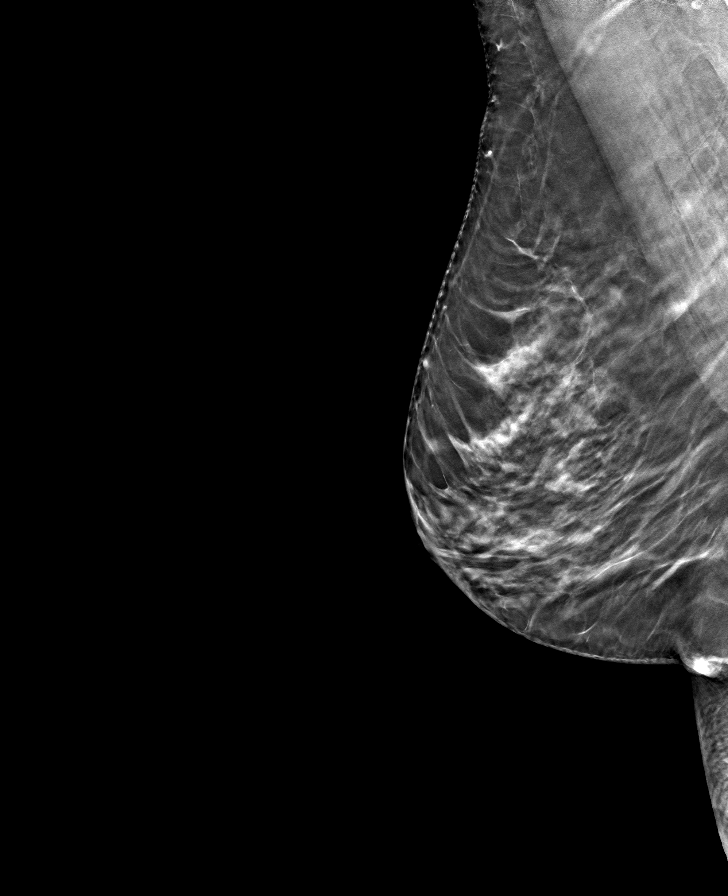

[L CC tomo · tomo slice 33/66.0]
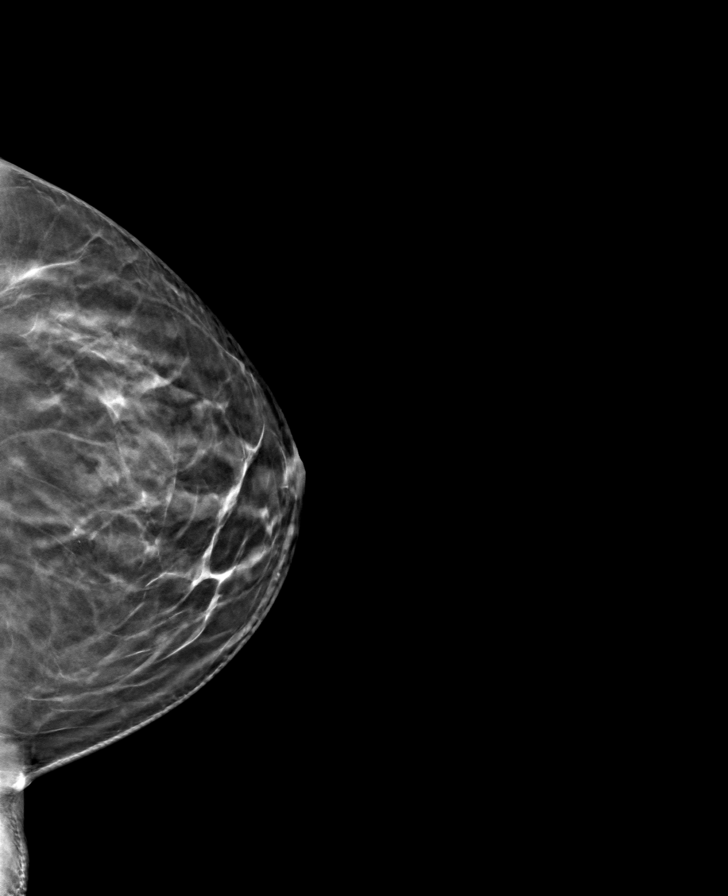

[L MLO tomo · tomo slice 35/69.0]
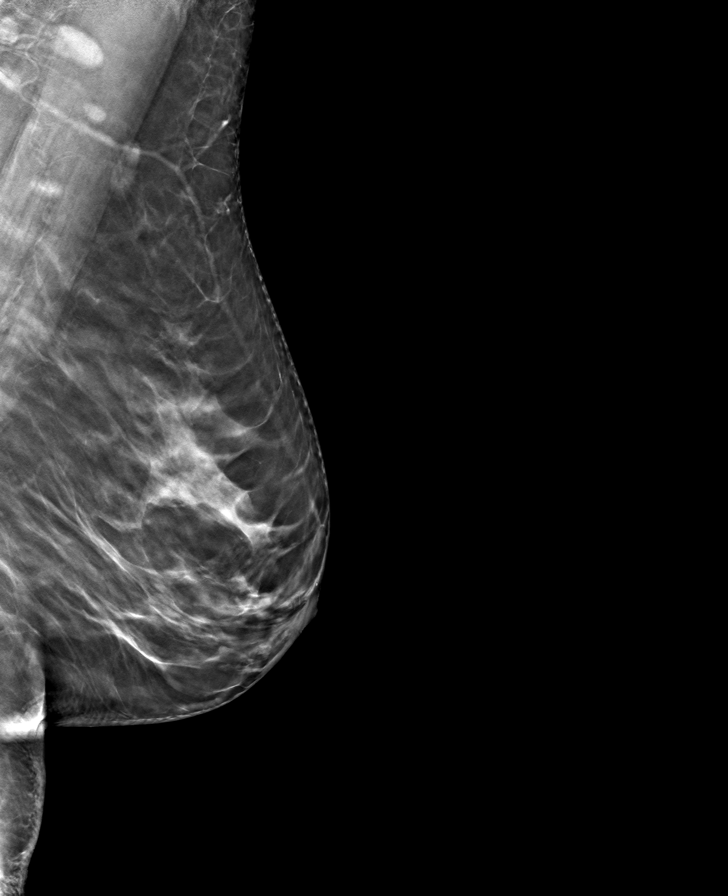

[R CC tomo · tomo slice 33/66.0]
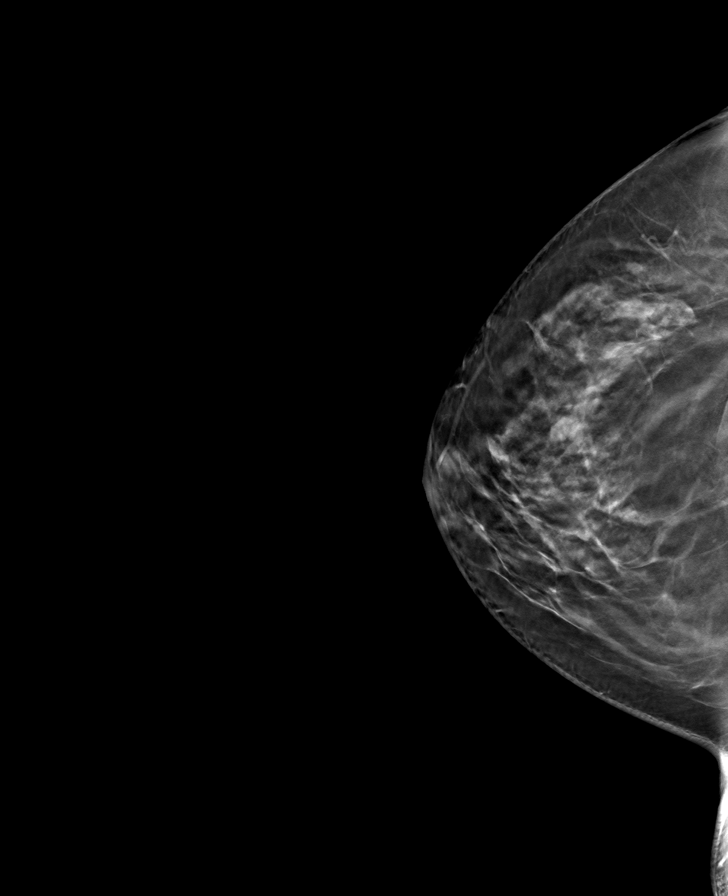

[8 of 24 positions shown; findings below may reference images not displayed]

ACR Breast Density Category c: The breast tissue is heterogeneously
dense, which may obscure small masses.
FINDINGS: There are no findings suspicious for malignancy.
IMPRESSION: No mammographic evidence of malignancy. A result letter of this
screening mammogram will be mailed directly to the patient.

RECOMMENDATION:
Screening mammogram in one year. (Code:Q3-W-BC3)

BI-RADS CATEGORY  1: Negative.
